# Patient Record
Sex: Female | Born: 1950
Health system: Southern US, Community
[De-identification: ages and names within clinical notes are randomized; demographics above are authoritative.]

## PROBLEM LIST (undated history)

## (undated) DIAGNOSIS — G43909 Migraine, unspecified, not intractable, without status migrainosus: Secondary | ICD-10-CM

## (undated) DIAGNOSIS — K219 Gastro-esophageal reflux disease without esophagitis: Secondary | ICD-10-CM

## (undated) DIAGNOSIS — K254 Chronic or unspecified gastric ulcer with hemorrhage: Secondary | ICD-10-CM

## (undated) DIAGNOSIS — R011 Cardiac murmur, unspecified: Secondary | ICD-10-CM

## (undated) DIAGNOSIS — Z8489 Family history of other specified conditions: Secondary | ICD-10-CM

## (undated) DIAGNOSIS — I10 Essential (primary) hypertension: Secondary | ICD-10-CM

## (undated) DIAGNOSIS — IMO0002 Reserved for concepts with insufficient information to code with codable children: Secondary | ICD-10-CM

## (undated) DIAGNOSIS — Z87442 Personal history of urinary calculi: Secondary | ICD-10-CM

## (undated) HISTORY — PX: UPPER GASTROINTESTINAL ENDOSCOPY: SHX188

## (undated) HISTORY — DX: Cardiac murmur, unspecified: R01.1

## (undated) HISTORY — DX: Chronic or unspecified gastric ulcer with hemorrhage: K25.4

## (undated) HISTORY — DX: Personal history of urinary calculi: Z87.442

## (undated) HISTORY — PX: KNEE SURGERY: SHX244

## (undated) HISTORY — PX: ELBOW SURGERY: SHX618

## (undated) HISTORY — PX: WISDOM TOOTH EXTRACTION: SHX21

## (undated) HISTORY — PX: SHOULDER ARTHROSCOPY: SHX128

## (undated) HISTORY — PX: TONSILLECTOMY: SUR1361

---

## 1980-01-24 HISTORY — PX: ABDOMINAL HYSTERECTOMY: SHX81

## 1994-12-13 ENCOUNTER — Encounter: Payer: Self-pay | Admitting: Internal Medicine

## 2000-04-16 ENCOUNTER — Encounter: Payer: Self-pay | Admitting: Family Medicine

## 2000-04-16 ENCOUNTER — Encounter: Admission: RE | Admit: 2000-04-16 | Discharge: 2000-04-16 | Payer: Self-pay | Admitting: Family Medicine

## 2000-04-19 ENCOUNTER — Encounter: Admission: RE | Admit: 2000-04-19 | Discharge: 2000-04-19 | Payer: Self-pay | Admitting: Family Medicine

## 2000-04-19 ENCOUNTER — Encounter: Payer: Self-pay | Admitting: Family Medicine

## 2000-04-25 ENCOUNTER — Ambulatory Visit (HOSPITAL_COMMUNITY): Admission: RE | Admit: 2000-04-25 | Discharge: 2000-04-25 | Payer: Self-pay | Admitting: Urology

## 2000-10-22 ENCOUNTER — Emergency Department (HOSPITAL_COMMUNITY): Admission: EM | Admit: 2000-10-22 | Discharge: 2000-10-22 | Payer: Self-pay | Admitting: Emergency Medicine

## 2000-10-22 ENCOUNTER — Encounter: Payer: Self-pay | Admitting: Emergency Medicine

## 2000-11-01 ENCOUNTER — Encounter: Admission: RE | Admit: 2000-11-01 | Discharge: 2000-11-01 | Payer: Self-pay | Admitting: Gynecology

## 2000-11-01 ENCOUNTER — Encounter: Payer: Self-pay | Admitting: Gynecology

## 2000-11-16 ENCOUNTER — Encounter: Payer: Self-pay | Admitting: Gynecology

## 2000-11-16 ENCOUNTER — Encounter: Admission: RE | Admit: 2000-11-16 | Discharge: 2000-11-16 | Payer: Self-pay | Admitting: Gynecology

## 2003-12-04 ENCOUNTER — Ambulatory Visit: Payer: Self-pay | Admitting: Internal Medicine

## 2003-12-24 ENCOUNTER — Emergency Department (HOSPITAL_COMMUNITY): Admission: EM | Admit: 2003-12-24 | Discharge: 2003-12-24 | Payer: Self-pay | Admitting: Emergency Medicine

## 2004-06-17 ENCOUNTER — Encounter: Admission: RE | Admit: 2004-06-17 | Discharge: 2004-06-17 | Payer: Self-pay | Admitting: *Deleted

## 2005-07-21 ENCOUNTER — Ambulatory Visit: Payer: Self-pay | Admitting: Internal Medicine

## 2005-08-04 ENCOUNTER — Ambulatory Visit: Payer: Self-pay

## 2005-08-04 ENCOUNTER — Encounter: Payer: Self-pay | Admitting: Internal Medicine

## 2005-08-07 ENCOUNTER — Ambulatory Visit: Payer: Self-pay | Admitting: Internal Medicine

## 2005-09-28 ENCOUNTER — Ambulatory Visit: Payer: Self-pay | Admitting: Internal Medicine

## 2006-12-03 ENCOUNTER — Encounter: Admission: RE | Admit: 2006-12-03 | Discharge: 2006-12-03 | Payer: Self-pay | Admitting: Obstetrics and Gynecology

## 2007-02-11 ENCOUNTER — Encounter: Admission: RE | Admit: 2007-02-11 | Discharge: 2007-02-11 | Payer: Self-pay | Admitting: Obstetrics and Gynecology

## 2007-08-16 ENCOUNTER — Encounter: Admission: RE | Admit: 2007-08-16 | Discharge: 2007-08-16 | Payer: Self-pay | Admitting: Obstetrics and Gynecology

## 2007-12-22 ENCOUNTER — Inpatient Hospital Stay (HOSPITAL_COMMUNITY): Admission: EM | Admit: 2007-12-22 | Discharge: 2007-12-30 | Payer: Self-pay | Admitting: Emergency Medicine

## 2007-12-23 ENCOUNTER — Ambulatory Visit: Payer: Self-pay | Admitting: Internal Medicine

## 2007-12-24 ENCOUNTER — Encounter: Payer: Self-pay | Admitting: Internal Medicine

## 2007-12-27 ENCOUNTER — Encounter: Payer: Self-pay | Admitting: Internal Medicine

## 2007-12-30 ENCOUNTER — Encounter (INDEPENDENT_AMBULATORY_CARE_PROVIDER_SITE_OTHER): Payer: Self-pay | Admitting: *Deleted

## 2008-01-21 ENCOUNTER — Other Ambulatory Visit: Admission: RE | Admit: 2008-01-21 | Discharge: 2008-01-21 | Payer: Self-pay | Admitting: Obstetrics and Gynecology

## 2008-01-21 ENCOUNTER — Encounter (INDEPENDENT_AMBULATORY_CARE_PROVIDER_SITE_OTHER): Payer: Self-pay | Admitting: Internal Medicine

## 2008-01-21 LAB — HM PAP SMEAR

## 2008-01-23 ENCOUNTER — Encounter: Admission: RE | Admit: 2008-01-23 | Discharge: 2008-01-23 | Payer: Self-pay | Admitting: Obstetrics and Gynecology

## 2008-03-11 DIAGNOSIS — J45909 Unspecified asthma, uncomplicated: Secondary | ICD-10-CM | POA: Insufficient documentation

## 2008-03-11 DIAGNOSIS — Z8711 Personal history of peptic ulcer disease: Secondary | ICD-10-CM | POA: Insufficient documentation

## 2008-03-11 DIAGNOSIS — K224 Dyskinesia of esophagus: Secondary | ICD-10-CM | POA: Insufficient documentation

## 2008-03-11 DIAGNOSIS — I10 Essential (primary) hypertension: Secondary | ICD-10-CM | POA: Insufficient documentation

## 2008-03-11 DIAGNOSIS — Z8679 Personal history of other diseases of the circulatory system: Secondary | ICD-10-CM | POA: Insufficient documentation

## 2008-03-11 DIAGNOSIS — K922 Gastrointestinal hemorrhage, unspecified: Secondary | ICD-10-CM | POA: Insufficient documentation

## 2008-03-11 DIAGNOSIS — F411 Generalized anxiety disorder: Secondary | ICD-10-CM | POA: Insufficient documentation

## 2008-03-11 DIAGNOSIS — Z87898 Personal history of other specified conditions: Secondary | ICD-10-CM | POA: Insufficient documentation

## 2008-03-11 DIAGNOSIS — D500 Iron deficiency anemia secondary to blood loss (chronic): Secondary | ICD-10-CM | POA: Insufficient documentation

## 2008-03-13 ENCOUNTER — Ambulatory Visit: Payer: Self-pay | Admitting: Internal Medicine

## 2009-01-14 ENCOUNTER — Encounter: Admission: RE | Admit: 2009-01-14 | Discharge: 2009-01-14 | Payer: Self-pay | Admitting: Obstetrics and Gynecology

## 2009-08-13 ENCOUNTER — Encounter: Admission: RE | Admit: 2009-08-13 | Discharge: 2009-08-13 | Payer: Self-pay | Admitting: Specialist

## 2009-08-23 ENCOUNTER — Encounter (INDEPENDENT_AMBULATORY_CARE_PROVIDER_SITE_OTHER): Payer: Self-pay | Admitting: Specialist

## 2009-08-23 ENCOUNTER — Ambulatory Visit: Payer: Self-pay | Admitting: Vascular Surgery

## 2009-08-23 ENCOUNTER — Ambulatory Visit: Admission: RE | Admit: 2009-08-23 | Discharge: 2009-08-23 | Payer: Self-pay | Admitting: Specialist

## 2010-02-11 ENCOUNTER — Encounter
Admission: RE | Admit: 2010-02-11 | Discharge: 2010-02-11 | Payer: Self-pay | Source: Home / Self Care | Attending: Obstetrics and Gynecology | Admitting: Obstetrics and Gynecology

## 2010-06-07 NOTE — H&P (Signed)
Kathleen Reyes, Kathleen Reyes                ACCOUNT NO.:  [id number]   MEDICAL RECORD NO.:  [id number]          PATIENT TYPE:  INP   LOCATION:  2903                         FACILITY:  [facility]   PHYSICIAN:  Eduard Clos, MDDATE OF BIRTH:  [birth year]   DATE OF ADMISSION:  12/22/2007  DATE OF DISCHARGE:                              HISTORY & PHYSICAL   CHIEF COMPLAINT:  Throwing up blood.   HISTORY OF PRESENT ILLNESS:  60 year old female with a history of  hypertension, migraine headaches who had recent renal stone and was on  ketorolac, history of CVA, previous history of GI bleed 23 years ago  when she needed a cauterization, presently with complaints of throwing  up blood twice today in the evening while taking shower.  The patient  initially felt some discomfort in the epigastric area, then she felt a  little dizzy, and then threw up blood twice.  Both times it was blood  mixed with some mucus.  Did not have any diarrhea or blood per stools.  In the ER the patient had positive guaiac stools.  Hemoglobin is around  10 and hematocrit of 32.  The patient has been admitted for further  evaluation and management of GI bleed.  The patient is also on aspirin  for her previous CVA.  The patient states that last week she did have  some abdominal pain when she was diagnosed with renal stones which have  been passed spontaneously.  Was given ketorolac for pain medication.  She did take for two days after that.  She developed some gastric  discomfort and she stopped taking it and today while taking bath she had  this episode where she threw up twice.  Denies any chest pain, shortness  of breath, palpitation, loss of consciousness, weakness of limbs,  dysuria, discharges, or diarrhea.   PAST MEDICAL HISTORY:  1. Asthma.  2. Hypertension.  3. History of previous GI bleed 23 years ago wherein she had required      cauterization.  4. Migraine headaches.   PAST SURGICAL HISTORY:  1.  Hysterectomy.  2. Appendectomy.  3. EGD with the cauterization for GI bleed.   MEDICATIONS:  1. Protonix 40 mg daily.  2. Topamax 25 mg p.o. b.i.d.  3. Aspirin 81 mg p.o. daily.  4. Albuterol HFA.  5. Advair Discus 100/50 one puff daily.  6. Climara for estrogen  replacement.  7. 12.5 mg p.o. daily.  8. Phenergan 25 mg p.o. q.6 p.r.n.  9. Topamax 75 mg p.o. daily.   ALLERGIES:  SULFA.   FAMILY HISTORY:  Significant for father having colon cancer.  The  patient did have a colonoscopy as above, which was negative per patient.   SOCIAL HISTORY:  The patient denies smoking cigarettes, drinks alcohol  occasionally, denies any drug abuse.   REVIEW OF SYSTEMS:  As per history of present illness, nothing else  significant.   PHYSICAL EXAMINATION:  The patient was examined at bedside, not in acute  distress.  VITAL SIGNS:  Blood pressure is 140/50, pulse 90 per minute, with a  temperature 97.4, respiration  18 per minute, with an O2 sat 100% room  air.  HEENT:  Anicteric, no pallor.  CHEST:  Bilateral air entry present.  No rhonchi.  No crepitation.  HEART:  S1 and S2 heard.  ABDOMEN:  Soft, nontender.  Bowel sounds present.  CNS:  Alert, awake, oriented to time, place, and person.  EXTREMITIES:  Moving upper and lower extremities 5/5.  Peripheral pulses  felt.  No edema.   LABS:  EKG normal sinus rhythm with no acute ST-T wave changes.  Chest x-  ray with moderate hyperinflation, no definite active process.  CBC:  WBC  10.1, hemoglobin 10.8, hematocrit 32.4, platelets 245,000, neutrophils  62%.  Complete metabolic panel:  Sodium 139, potassium 3.7, chloride  111, carbon dioxide 22, glucose 113, BUN 39, creatinine 0.6, total  bilirubin 0.6, alkaline phosphatase 51, AST 19, ALT 15, total protein  6.4, albumin 4, calcium 9, lipase 40.  UA is positive for ketones,  negative for glucose, nitrites, leukocytes.  Stool for occult blood is  positive.   ASSESSMENT:  1. Gastrointestinal  bleed.  2. History of hypertension.  3. History of previous gastrointestinal bleed 23 years ago, requiring      cauterization.  4. History of cerebrovascular accident.  5. History of migraines.  6. Recent use of nonsteroidal anti-inflammatory drugs.   PLAN:  To move the patient to step-down unit for closer monitoring.  We  will check CBC now and q.6 hourly for the next 48 hours.  Type and cross  match 4 units packed red blood cells.  Transfuse if there is any  significant reduction in the hemoglobin or is becoming hemodynamically  unstable.  Place the patient on D5 normal saline __________ medication.  Hold off aspirin and Toprol XL for now and I have already discussed with  on-call gastroenterologist from [facility] and we will place the patient on  IV Protonix drips.      Eduard Clos, MD  Electronically Signed     ANK/MEDQ  D:  12/22/2007  T:  12/22/2007  Job:  [id number]

## 2010-06-07 NOTE — Discharge Summary (Signed)
NAME:  Kathleen Reyes, DIDONATO NO.:  [id number]   MEDICAL RECORD NO.:  [id number]          PATIENT TYPE:  INP   LOCATION:  5021                         FACILITY:  [facility]   PHYSICIAN:  Isidor Holts, M.D.  DATE OF BIRTH:  1950/02/10   DATE OF ADMISSION:  12/22/2007  DATE OF DISCHARGE:  12/30/2007                               DISCHARGE SUMMARY   PRIMARY MEDICAL DOCTOR:  Dr. Lois Huxley, [facility], [place], [place].   DISCHARGE DIAGNOSES:  1. 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      Required endoscopic intervention x2.  2. Acute blood loss anemia secondary to #1.  Required transfusion of 3      units packed red blood cells.  3. Hypertension.  4. Previous history of cerebrovascular accident.  5. Urolithiasis.  6. Bronchial asthma.  7. History of migraines.  8. History of gastrointestinal bleed 23 years ago, that required      cautery.   DISCHARGE MEDICATIONS:  1. Protonix 40 mg p.o. b.i.d. for 1 month, and then once daily.  2. Topamax 25 mg p.o. t.i.d.  3. Albuterol inhaler p.r.n. q.i.d.  4. Climara 0.06 mg p.o. daily.  5. Ambien 10 mg p.r.n. q.h.s.  6. Advair Diskus (100/50) one puff b.i.d.  7. Toprol-XL 25 mg p.o. daily.  8. Nu-Iron 150 mg p.o. daily, to be commenced from January 13, 2008.   Note: Recommended to avoid all NSAIDS.   CONSULTATIONS:  1. Dr. Lina Sar, gastroenterology.  2. Dr. Cyndia Bent, surgeon.   PROCEDURES:  1. Chest x-ray dated January 17, 2008.  This showed moderate      hyperinflation.  No definite active process.  2. Upper GI endoscopy on December 24, 2007 performed by Dr. Lina Sar.  This showed an ulcer in the pyloric sphincter at 2      o'clock, minimum size 10 mm, maximum size 30 mm, actively      bleeding/oozing, injected with epinephrine four quadrants and a      clip used.  However, clip fell off due to location of the fibrous      tissue.  3. Repeat upper GI  endoscopy on December 27, 2007 by Dr. Lina Sar.      This revealed a pyloric channel ulcer as described above, actively      bleeding, injected with epinephrine four quadrant within 1 mm.      Outcome successful.   ADMISSION HISTORY:  As in H&P note of December 22, 2007 dictated by Dr.  Eduard Clos.  However, in brief, this is a 60 year old female,  with known history of bronchial asthma, hypertension, history of  previous GI bleed 23 years ago, status post cauterization, migraine  headaches, previous CVA, urolithiasis, who presented with 2 episodes of  hematemesis while taking a shower on the evening of admission.  Apparently, she had been diagnosed with renal stones which had passed  spontaneously, and she was given ketorolac for pain medication, which  she took for 2 days, but stopped after she developed abdominal  discomfort.  At the time of the initial evaluation in the emergency  department, she was found to have a hemoglobin of 10.8, blood pressure  was 140/50, pulse 90 per minute.  She was admitted for further  evaluation, investigation and management.   CLINICAL COURSE:  1. Upper GI bleed.  For details of presentation, refer to admission      history above.  The patient's hemoglobin at the time of      presentation was 10.8; however, by the a.m. of December 23, 2007,      hemoglobin had dropped to 8.7.  Blood pressure was 98/60 against      admission blood pressure of 140/50.  She was transfused with 2      units of packed red blood cells, resulting in satisfactory post      transfusion bump in hemoglobin to 10.2 on December 24, 2007.  She      underwent upper GI endoscopy by Dr. Lina Sar,      gastroenterologist, who was called on consultation, on December 24, 2007.  This showed a pyloric channel ulcer which was addressed with      Epinephrine injection.  An attempt was made to place an endoclip,      but this fell off secondary to location and fibrous  tissue.   1. Acute blood loss anemia.  As described above, the patient required      2 units of packed red blood cells in the a.m. of December 23, 2007      for a significant drop in hemoglobin associated with      hemodynamically instability.  Post transfusion hemoglobin was 10.2      on December 24, 2007.  However, on December 26, 2007, the patient      experienced a further drop in hemoglobin to 8.7 necessitating      transfusion with a further 1 unit of packed red blood cells, with a      bump in hemoglobin to 9.9.  She underwent repeat upper GI endoscopy      on December 27, 2007.  Again, the same bleeding lesion was      visualized and injected once again with Epinephrine.  Per GI      recommendations, surgical consultation was called.  This was kindly      provided by Dr. Jamey Ripa.  For details of that consultation, refer to      consultation note of December 27, 2007.  Per surgical      recommendations, continued observation for rebleeding was      indicated, and should this recur, the plan would be to provide      surgical intervention.   1. History of bronchial asthma.  The patient remained asymptomatic      from this viewpoint, throughout the course of her hospitalization.   1. Hypertension.  The patient remained normotensive throughout the      course of her hospitalization.   1. Migraine headaches.  This did not prove problematic.   DISPOSITION:  The patient was on December 30, 2007, considered  clinically stable for discharge.  During the course of her  hospitalization, she had been managed with intravenous infusion of  Protonix; however, on December 29, 2007, she was transitioned to twice  daily Protonix.  She was reviewed by Dr. Iva Boop in the a.m. of  December 30, 2007, and, as she had had no further occurrence of her GI  bleed and hemoglobin remained stable at 9.9, she was considered  clinically stable for discharge from a gastroenterology viewpoint.    DIET:  Heart healthy.   ACTIVITY:  As tolerated.  Recommended to increase activity slowly.   FOLLOW UP INSTRUCTIONS:  The patient is to follow up with the  gastroenterologist, Dr. Lina Sar, telephone number [phone].  An  appointment has been scheduled for January 29, 2008, at 11:00 a.m.  In  addition, the patient is recommended to follow up with her primary M.D.,  Dr. Lois Huxley, within 1 week of discharge.  She is to call for an  appointment.   SPECIAL INSTRUCTIONS:  The patient has been recommended to present to  the nearest hospital should she experience melenic stools or profound  weakness.  She has, of course, been strongly advised to avoid all anti-  inflammatory medications.  All this has been communicated to the patient  and her spouse, who verbalized understanding.      Isidor Holts, M.D.  Electronically Signed     CO/MEDQ  D:  12/30/2007  T:  12/30/2007  Job:  [id number]   cc:   Lois Huxley, M.D.  Hedwig Morton. Juanda Chance, MD

## 2010-06-07 NOTE — Consult Note (Signed)
Kathleen Reyes, Kathleen Reyes                ACCOUNT NO.:  [id number]   MEDICAL RECORD NO.:  [id number]          PATIENT TYPE:  INP   LOCATION:  2927                         FACILITY:  [facility]   PHYSICIAN:  Currie Paris, M.D.DATE OF BIRTH:  1950-06-15   DATE OF CONSULTATION:  12/27/2007  DATE OF DISCHARGE:                                 CONSULTATION   REQUESTING PHYSICIAN:  Dora M. Juanda Chance, MD   REASON FOR CONSULTATION:  Recurrent bleeding, pyloric channel ulcer.   HISTORY OF PRESENT ILLNESS:  Ms. Hodgens is a 60 year old female patient  with history of hypertension and asthma who has remote history of GI  bleeding about 23 years prior.  She has a known sensitivity issue  related to NSAID use, but had a recent orthopedic injury and started an  NSAID short-term trial from the local orthopedist.  After 2 doses, the  patient began having abdominal pain and stopped the medication.  She was  admitted on December 22, 2007 with hematemesis and symptomatic acute  blood loss anemia.  She initially underwent endoscopy several days ago  that demonstrated a pyloric channel ulcer.  The location of this ulcer  was not suitable for clip occasion nor was there any obvious area within  the ulcer itself that could be clipped.  It was a diffuse disease in  process.  Therefore, the patient underwent injection of epinephrine and  cautery procedure per Dr. Juanda Chance.  The patient was stable until past 24  hours when her hemoglobin began to drift again.  She has lost about 2  grams so far.  She returned to endoscopy today where she new had  recurrent bleeding in the same area.  This ulcer is estimated to be  about 1.3 cm in total diameter.  She has been given similar treatment  and so far the bleeding has stopped.  A surgical consultation is  requested in the event surgical intervention needs to be undertaken due  to continued bleeding.   REVIEW OF SYSTEMS:  As per the history of present illness.  GI:  The  patient reports that the bleeding is painless.  She has not had any more  hematemesis since arrival and as noted she has not had any problems up  until recently with recurrent GI bleeding.   SOCIAL HISTORY:  No tobacco.  Social alcohol.  Family is at bedside.   FAMILY MEDICAL HISTORY:  Noncontributory to this admission.   PAST MEDICAL HISTORY:  1. Asthma.  2. Hypertension.  3. Remote GI bleed 23 years prior.  4. Migraine headaches.   PAST SURGICAL HISTORY:  1. Abdominal hysterectomy via Pfannenstiel incision.  2. Appendectomy.   CURRENT MEDICATIONS:  Protonix, Topamax, Advair, and Carafate.   ALLERGIES:  NKDA.   PHYSICAL EXAMINATION:  GENERAL:  Pleasant female patient, currently  moderately sedated but arousable and talking in endoscopy.  Family is at  the bedside.  VITAL SIGNS:  Temperature 98.2, BP 108/74, pulse 86 and regular, and  respirations 16.  PSYCH:  The patient is recovering from sedation from endoscopy, but she  is arousable and affect is  appropriate to current situation.  NEUROLOGIC:  Cranial nerves II through XII are grossly intact given the  fact the patient is somewhat sedated.  She is moving all extremities x4.  She is examined on the stretcher in endoscopy.  EYES:  Sclerae noninjected and nonicteric.  EARS, NOSE, AND THROAT:  Ears are symmetrical.  No otorrhea and no  rhinorrhea.  Oral mucous membranes are pink and moist.  CHEST:  Bilateral lung sounds are clear to auscultation.  Respiratory  effort is nonlabored.  She is saturating 98% on 2 L O2.  CARDIOVASCULAR:  Heart sounds are S1 and S2.  No murmurs, rubs, clicks,  or gallops.  No JVD.  No peripheral edema.  ABDOMEN:  Soft, nontender with distant bowel sounds present.  EXTREMITIES:  Symmetrical in appearance without cyanosis or clubbing.   LABORATORY DATA:  Hemoglobin today was 9.7.  In the past 48 hours, her  hemoglobin has dropped to this from just above 11, and hematocrit 28.2.  Sodium 138,  potassium 3.3, CO2 20, glucose 91, BUN 5, and creatinine  0.60.   DIAGNOSTICS:  Endoscopy is noted with a 1.3 cm ulcerated area in the  pyloric channel.  Chest x-ray at admission showed no acute process.   IMPRESSION:  1. Recurrent gastrointestinal bleeding, pyloric channel ulcer with      associated acute blood loss anemia.  2. Asthma, compensated.  3. Hypertension, controlled.  4. History of migraines.   PLAN:  1. We will continue to observe for continued bleeding.  If this is a      problem, the patient will probably need operative intervention.  2. Gastroenterologist has placed the patient on Protonix drip, NG tube      for abdominal decompression, and transfer to Step-Down Unit.      Allison L. Rennis Harding, N.P.      Currie Paris, M.D.  Electronically Signed   ALE/MEDQ  D:  12/27/2007  T:  12/28/2007  Job:  [id number]   cc:   Hedwig Morton. Juanda Chance, MD

## 2010-06-10 NOTE — Op Note (Signed)
[facility]  Patient:    Kathleen Reyes, Kathleen Reyes                   MRN: [id number] Proc. Date: 04/25/00 Adm. Date:  [id number] Attending:  Laqueta Jean CC:         Burnell Blanks, M.D., [place] [facility]   Operative Report  PREOPERATIVE DIAGNOSIS:  Right flank pain radiating to the right lower quadrant.  POSTOPERATIVE DIAGNOSIS:  Right flank pain radiating to the right lower quadrant.  OPERATION:  Cystourethroscopy, right retrograde pyelogram, right ureteroscopy, right double-J catheter.  SURGEON:  Sigmund I. Patsi Sears, M.D.  ANESTHESIA:  General.  REVIEW OF HISTORY:  [name] is a 60 year old married white female, with two weeks of right lower quadrant pain, right flank pain.  She had noncontrast CT at [facility], which showed mild right hydronephrosis, but no stone was identified.  The patient is ten years status post TAH/BSO for endometriosis. She has a history of mitral valve prolapse as well, and a history of bleeding ulcer.  She is now for cystoscopy, retrograde pyelography.  PREPARATION:  After appropriate preanesthesia, the patient is brought to the operating room and placed on the operating table in the dorsal supine position where general LMA anesthesia was introduced.  She was then replaced in dorsal lithotomy position where the pubis was prepped with Betadine solution and draped in the usual fashion.  DESCRIPTION OF PROCEDURE:  Cystoscopy was performed which showed a normal appearing bladder.  Right retrograde pyelogram was performed through a normal appearing ureter, and retrograde pyelogram showed a normal ureter without dilation.  Ureteroscopy was accomplished with the 6 French short ureteroscope, and ureteroscope was able to be passed alongside a wire, in which showed into the renal pelvis.  The renal pelvis appeared normal with no evidence of stone or hyperemia.  It was elected to place the double-J because of  the patients chronic pain, as discussed with her preop.  Therefore, a 6 cm x 24 cm double-J catheter was passed over the guidewire into the renal pelvis under fluoroscopic control. The tube coiled in the bladder and then the renal pelvis.  The bladder was drained of fluid.  Xylocaine jelly was left in the ureter and B&O suppository was given prior to awakening.  She was not given any IV Toradol prior to awakening because of her history of ulcer disease.  The patient tolerated the procedure well, was awakened, and taken to the recovery room in good condition. DD:  04/25/00 TD:  04/25/00 Job: [id number]

## 2010-06-10 NOTE — Assessment & Plan Note (Signed)
Kathleen Reyes                              CARDIOLOGY OFFICE NOTE   NAME:Reyes, Kathleen HARTL                       MRN:          [id number]  DATE:09/28/2005                            DOB:          10/12/1950    IDENTIFICATION:  Kathleen Reyes is a 60 year old woman who I saw back in mid-  July and, prior to that, on a first-time, first visit on June 29.  Had a  history of chest pain.   Evaluation for chest pain included a Myoview scan which showed no ischemia  and an echocardiogram that showed no evidence of MVP.  LVF was normal.   I placed the patient on Protonix and she noted some improvement when I last  saw her.  I wanted to see her back to confirm some resolution.   Since then, the patient has done very well.  She denies chest pain. No  shortness of breath, except for 1 episode when she was under extreme stress.   Remains active.   CURRENT MEDICATIONS:  1. Advair 100/50 q. day to b.i.d.  2. Fluticasone 50 q. day.  3. Aspirin 81 mg q. day.  4. Multivitamin q. day.  5. Topamax.  6. Protonix 40 b.i.d.  7. Toprol XL 25 q. day.   PHYSICAL EXAMINATION:  GENERAL:  The patient is in no distress.  VITAL SIGNS:  Blood pressure 122/74.  Pulse 60 and regular.  Weight 135.  LUNGS:  Clear.  No wheezes.  CARDIAC:  Regular rate and rhythm.  S1, S2.  No definite S3, S4, or murmurs.  ABDOMEN:  Benign.  EXTREMITIES:  No edema.   IMPRESSION:  Chest pain, resolved, on Protonix.  I told her she could try  backing down to 40 q. day.  If her symptoms increase, then she should be  followed up in Gastrointestinal.   Otherwise, I would not schedule a definite follow up unless her symptoms  change, worsen.                               Pricilla Riffle, MD, [facility]    PVR/MedQ  DD:  09/28/2005 DT:  09/28/2005 Job #:  [id number]

## 2010-06-10 NOTE — Assessment & Plan Note (Signed)
[facility]                              CARDIOLOGY OFFICE NOTE   NAME:Kathleen Reyes, Kathleen Reyes                       MRN:          [id number]  DATE:08/07/2005                            DOB:          [birth year]    INDICATIONS:  Kathleen Reyes is a 60 year old woman with history of chest pain.  I saw her for the first time back on June 29.  Refer to this for the full  details.   When I saw her, I was concerned for ischemia and I went ahead and scheduled  her for Myoview scan.  I also set an echocardiogram, given a murmur.  The  patient had a Myoview scan on July 13.  She exercised for 8 minutes and 30  seconds with no EKG changes.  Myoview scan showed normal perfusion, LVF was  66%.   The patient also had an echocardiogram done.  This showed no evidence of  MVP.  LV function was normal at 60%.   In the interval, the patient says she has still had chest pressure with  stress.  She has cut back on some of her physical activity until she found  out the test results.   CURRENT MEDICATIONS:  1.  Advair 100/50 b.i.d.  2.  Fluticasone daily.  3.  Aspirin 81 mg daily.  4.  Multivitamin daily.  5.  Stool softener.  6.  Topamax.  7.  Protonix 20 mg b.i.d.  8.  Toprol XL 25 mg daily.   PHYSICAL EXAMINATION:  GENERAL APPEARANCE:  The patient is in no distress.  VITAL SIGNS:  Blood pressure 136/78, pulse 78, weight 131.  LUNGS:  Clear.  CARDIAC:  Regular rate and rhythm.  S1, S2.  Grade 1-2/6 systolic murmur  heard best over the left sternal border.  ABDOMEN:  Benign.  EXTREMITIES:  No edema.   IMPRESSION:  Chest pressure.  Myoview scan looks good.  I would recommend  going up on her Protonix to 40 b.i.d.  She says there might be a slight  improvement in her symptoms.  Note:  In the past she had an upper endoscopy  done in 1996 because of food impaction, and she was found to have  esophagitis at that time as well as a peristaltic esophagus.  She denies,  though, having problems coughing out chunks of food now.  She had an  esophageal dilatation, but it does not sound like swallowing is the problem  now and she may have inflammation.  We would like to follow her up in one  months time.  Continue current regimen.  If her symptoms worsen, of course,  she should call.   I encouraged her to slowly increase her activity, and we will see how she  does.                                Pricilla Riffle, MD, [facility]    PVR/MedQ  DD:  08/07/2005  DT:  08/08/2005  Job #:  [id number]   cc:  Antonieta Pert, FNP

## 2010-08-05 ENCOUNTER — Inpatient Hospital Stay (INDEPENDENT_AMBULATORY_CARE_PROVIDER_SITE_OTHER)
Admission: RE | Admit: 2010-08-05 | Discharge: 2010-08-05 | Disposition: A | Discharge status: Home / Self Care | Payer: 59 | Source: Ambulatory Visit | Attending: Family Medicine | Admitting: Family Medicine

## 2010-08-05 DIAGNOSIS — J019 Acute sinusitis, unspecified: Secondary | ICD-10-CM

## 2010-08-05 DIAGNOSIS — H7290 Unspecified perforation of tympanic membrane, unspecified ear: Secondary | ICD-10-CM

## 2010-10-25 LAB — DIFFERENTIAL
Basophils Relative: 1 % (ref 0–1)
Lymphocytes Relative: 30 % (ref 12–46)
Lymphs Abs: 3 10*3/uL (ref 0.7–4.0)
Monocytes Absolute: 0.6 10*3/uL (ref 0.1–1.0)
Monocytes Relative: 6 % (ref 3–12)

## 2010-10-25 LAB — CBC
HCT: 26.1 % — ABNORMAL LOW (ref 36.0–46.0)
HCT: 32.4 % — ABNORMAL LOW (ref 36.0–46.0)
MCHC: 33.4 g/dL (ref 30.0–36.0)
MCHC: 34 g/dL (ref 30.0–36.0)
MCV: 95.1 fL (ref 78.0–100.0)
MCV: 96.7 fL (ref 78.0–100.0)
Platelets: 196 10*3/uL (ref 150–400)
Platelets: 197 10*3/uL (ref 150–400)
Platelets: 245 10*3/uL (ref 150–400)
RBC: 3.35 MIL/uL — ABNORMAL LOW (ref 3.87–5.11)
RDW: 13.7 % (ref 11.5–15.5)
RDW: 14 % (ref 11.5–15.5)
RDW: 14.1 % (ref 11.5–15.5)
WBC: 10.1 10*3/uL (ref 4.0–10.5)

## 2010-10-25 LAB — URINALYSIS, ROUTINE W REFLEX MICROSCOPIC
Glucose, UA: NEGATIVE mg/dL
Ketones, ur: 15 mg/dL — AB
Protein, ur: NEGATIVE mg/dL
Specific Gravity, Urine: 1.021 (ref 1.005–1.030)
Urobilinogen, UA: 1 mg/dL (ref 0.0–1.0)
pH: 6.5 (ref 5.0–8.0)

## 2010-10-25 LAB — COMPREHENSIVE METABOLIC PANEL
ALT: 15 U/L (ref 0–35)
AST: 15 U/L (ref 0–37)
Albumin: 3 g/dL — ABNORMAL LOW (ref 3.5–5.2)
BUN: 29 mg/dL — ABNORMAL HIGH (ref 6–23)
Calcium: 8.1 mg/dL — ABNORMAL LOW (ref 8.4–10.5)
Creatinine, Ser: 0.57 mg/dL (ref 0.4–1.2)
GFR calc Af Amer: >60 mL/min (ref >60)
GFR calc Af Amer: >60 mL/min (ref >60)
GFR calc non Af Amer: >60 mL/min (ref >60)
Potassium: 3.7 mEq/L (ref 3.5–5.1)
Sodium: 139 mEq/L (ref 135–145)
Total Bilirubin: 0.6 mg/dL (ref 0.3–1.2)
Total Protein: 4.7 g/dL — ABNORMAL LOW (ref 6.0–8.3)

## 2010-10-25 LAB — CROSSMATCH

## 2010-10-25 LAB — URINE CULTURE
Colony Count: NO GROWTH
Culture: NO GROWTH

## 2010-10-25 LAB — LIPID PANEL
HDL: 31 mg/dL — ABNORMAL LOW (ref >39)
Total CHOL/HDL Ratio: 4.4 RATIO
Triglycerides: 66 mg/dL (ref <150)
VLDL: 13 mg/dL (ref 0–40)

## 2010-10-25 LAB — PROTIME-INR: Prothrombin Time: 14.2 seconds (ref 11.6–15.2)

## 2010-10-25 LAB — PREPARE RBC (CROSSMATCH)

## 2010-10-25 LAB — OCCULT BLOOD X 1 CARD TO LAB, STOOL: Fecal Occult Bld: POSITIVE

## 2010-10-25 LAB — TSH: TSH: 2.712 u[IU]/mL (ref 0.350–4.500)

## 2010-10-28 LAB — BASIC METABOLIC PANEL
BUN: 4 mg/dL — ABNORMAL LOW (ref 6–23)
BUN: 5 mg/dL — ABNORMAL LOW (ref 6–23)
CO2: 20 mEq/L (ref 19–32)
Calcium: 7.7 mg/dL — ABNORMAL LOW (ref 8.4–10.5)
Calcium: 7.9 mg/dL — ABNORMAL LOW (ref 8.4–10.5)
Calcium: 8.4 mg/dL (ref 8.4–10.5)
Creatinine, Ser: 0.59 mg/dL (ref 0.4–1.2)
Creatinine, Ser: 0.6 mg/dL (ref 0.4–1.2)
GFR calc non Af Amer: >60 mL/min (ref >60)
GFR calc non Af Amer: >60 mL/min (ref >60)
GFR calc non Af Amer: >60 mL/min (ref >60)
Glucose, Bld: 103 mg/dL — ABNORMAL HIGH (ref 70–99)
Glucose, Bld: 167 mg/dL — ABNORMAL HIGH (ref 70–99)
Glucose, Bld: 96 mg/dL (ref 70–99)
Glucose, Bld: 97 mg/dL (ref 70–99)
Potassium: 3.4 mEq/L — ABNORMAL LOW (ref 3.5–5.1)
Sodium: 136 mEq/L (ref 135–145)
Sodium: 138 mEq/L (ref 135–145)
Sodium: 140 mEq/L (ref 135–145)

## 2010-10-28 LAB — HEMOGLOBIN AND HEMATOCRIT, BLOOD
HCT: 25.5 % — ABNORMAL LOW (ref 36.0–46.0)
HCT: 25.9 % — ABNORMAL LOW (ref 36.0–46.0)
HCT: 27 % — ABNORMAL LOW (ref 36.0–46.0)
HCT: 28.7 % — ABNORMAL LOW (ref 36.0–46.0)
HCT: 29.1 % — ABNORMAL LOW (ref 36.0–46.0)
HCT: 29.5 % — ABNORMAL LOW (ref 36.0–46.0)
HCT: 30.9 % — ABNORMAL LOW (ref 36.0–46.0)
HCT: 32.3 % — ABNORMAL LOW (ref 36.0–46.0)
Hemoglobin: 10.3 g/dL — ABNORMAL LOW (ref 12.0–15.0)
Hemoglobin: 10.7 g/dL — ABNORMAL LOW (ref 12.0–15.0)
Hemoglobin: 8.5 g/dL — ABNORMAL LOW (ref 12.0–15.0)
Hemoglobin: 8.7 g/dL — ABNORMAL LOW (ref 12.0–15.0)
Hemoglobin: 9.7 g/dL — ABNORMAL LOW (ref 12.0–15.0)

## 2010-10-28 LAB — CROSSMATCH: ABO/RH(D): O POS

## 2010-10-28 LAB — CBC
HCT: 27 % — ABNORMAL LOW (ref 36.0–46.0)
HCT: 30 % — ABNORMAL LOW (ref 36.0–46.0)
Hemoglobin: 10.2 g/dL — ABNORMAL LOW (ref 12.0–15.0)
Hemoglobin: 11.7 g/dL — ABNORMAL LOW (ref 12.0–15.0)
Hemoglobin: 9.1 g/dL — ABNORMAL LOW (ref 12.0–15.0)
Hemoglobin: 9.3 g/dL — ABNORMAL LOW (ref 12.0–15.0)
Hemoglobin: 9.5 g/dL — ABNORMAL LOW (ref 12.0–15.0)
Hemoglobin: 9.9 g/dL — ABNORMAL LOW (ref 12.0–15.0)
Hemoglobin: 9.9 g/dL — ABNORMAL LOW (ref 12.0–15.0)
Hemoglobin: 9.9 g/dL — ABNORMAL LOW (ref 12.0–15.0)
MCHC: 33 g/dL (ref 30.0–36.0)
MCHC: 33.1 g/dL (ref 30.0–36.0)
MCHC: 33.4 g/dL (ref 30.0–36.0)
MCHC: 33.9 g/dL (ref 30.0–36.0)
MCHC: 34.4 g/dL (ref 30.0–36.0)
MCV: 96.2 fL (ref 78.0–100.0)
Platelets: 158 10*3/uL (ref 150–400)
Platelets: 159 10*3/uL (ref 150–400)
Platelets: 164 10*3/uL (ref 150–400)
Platelets: 204 10*3/uL (ref 150–400)
RBC: 2.9 MIL/uL — ABNORMAL LOW (ref 3.87–5.11)
RDW: 15 % (ref 11.5–15.5)
RDW: 15.1 % (ref 11.5–15.5)
RDW: 15.3 % (ref 11.5–15.5)
RDW: 15.3 % (ref 11.5–15.5)
RDW: 15.5 % (ref 11.5–15.5)
RDW: 15.8 % — ABNORMAL HIGH (ref 11.5–15.5)
RDW: 15.9 % — ABNORMAL HIGH (ref 11.5–15.5)
WBC: 8 10*3/uL (ref 4.0–10.5)

## 2010-10-28 LAB — COMPREHENSIVE METABOLIC PANEL
ALT: 17 U/L (ref 0–35)
ALT: 19 U/L (ref 0–35)
Albumin: 2.6 g/dL — ABNORMAL LOW (ref 3.5–5.2)
Alkaline Phosphatase: 28 U/L — ABNORMAL LOW (ref 39–117)
BUN: 2 mg/dL — ABNORMAL LOW (ref 6–23)
Calcium: 8 mg/dL — ABNORMAL LOW (ref 8.4–10.5)
Chloride: 115 mEq/L — ABNORMAL HIGH (ref 96–112)
Glucose, Bld: 104 mg/dL — ABNORMAL HIGH (ref 70–99)
Glucose, Bld: 91 mg/dL (ref 70–99)
Potassium: 4.2 mEq/L (ref 3.5–5.1)
Sodium: 138 mEq/L (ref 135–145)
Sodium: 141 mEq/L (ref 135–145)
Total Bilirubin: 0.4 mg/dL (ref 0.3–1.2)
Total Protein: 4.5 g/dL — ABNORMAL LOW (ref 6.0–8.3)

## 2010-10-28 LAB — PROTIME-INR: INR: 1.2 (ref 0.00–1.49)

## 2010-12-22 IMAGING — MG MM DIAGNOSTIC BILATERAL
6 series · 6 of 6 positions shown · non-contrast
Comparison: 12/03/2006 and the right breast mammogram 08/16/2007.

CLINICAL DATA: Patient presents for 6-month follow-up right breast
mammogram as it is also time for patient's annual bilateral
mammogram.  This is to follow-up a group of microcalcifications in
the upper outer right breast.

DIGITAL DIAGNOSTIC BILATERAL MAMMOGRAM WITH CAD

[R CC (1 of 2)]
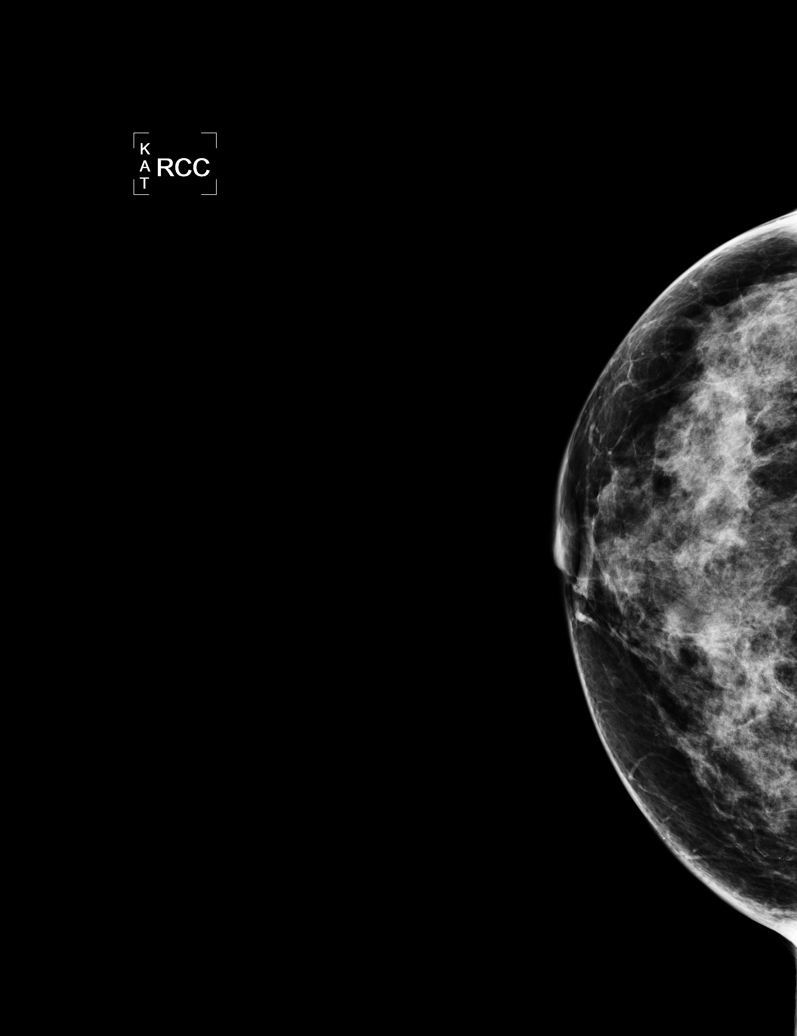

[L CC]
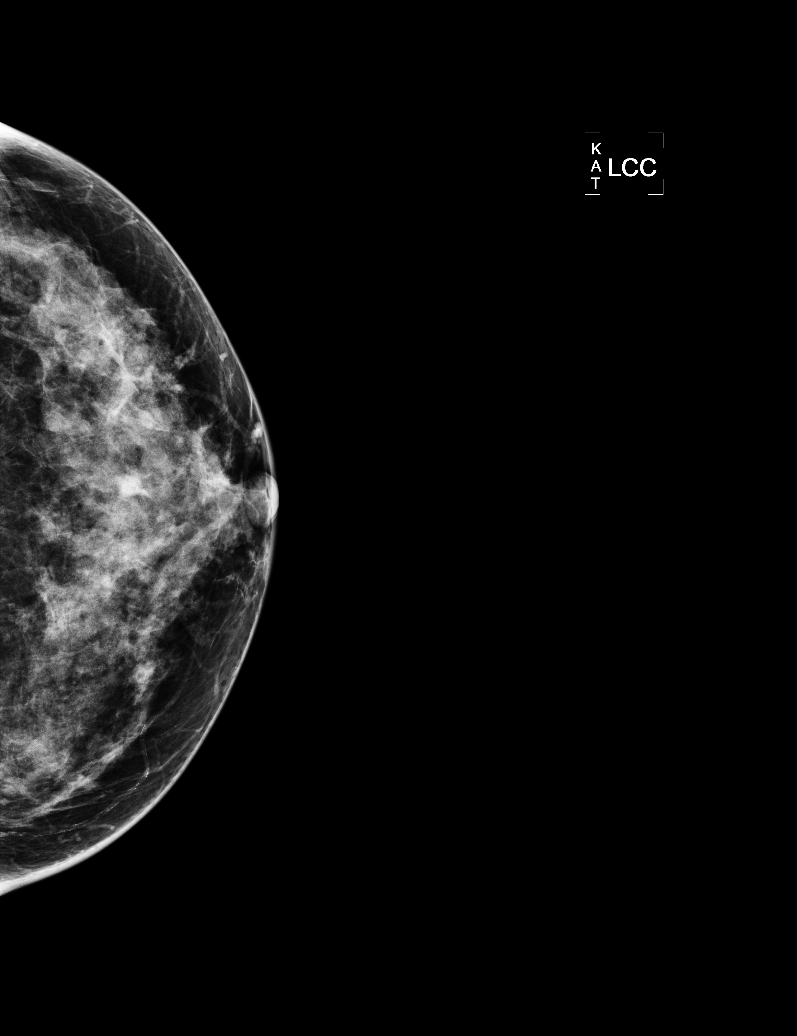

[L MLO]
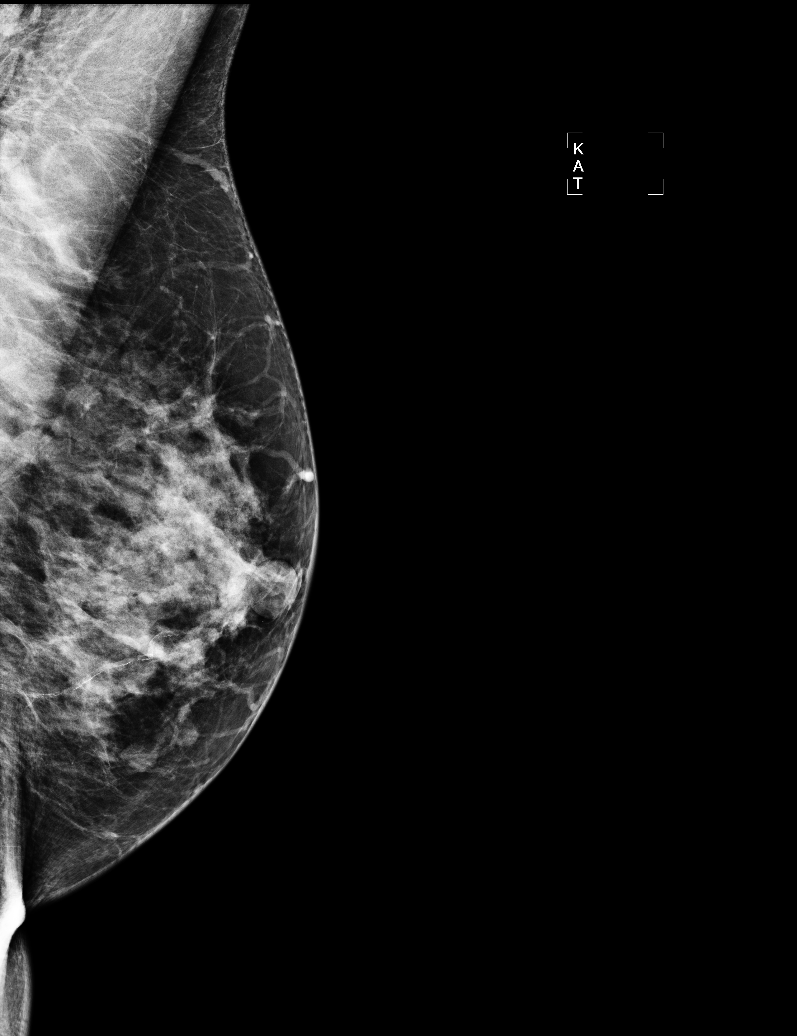

[R MLO]
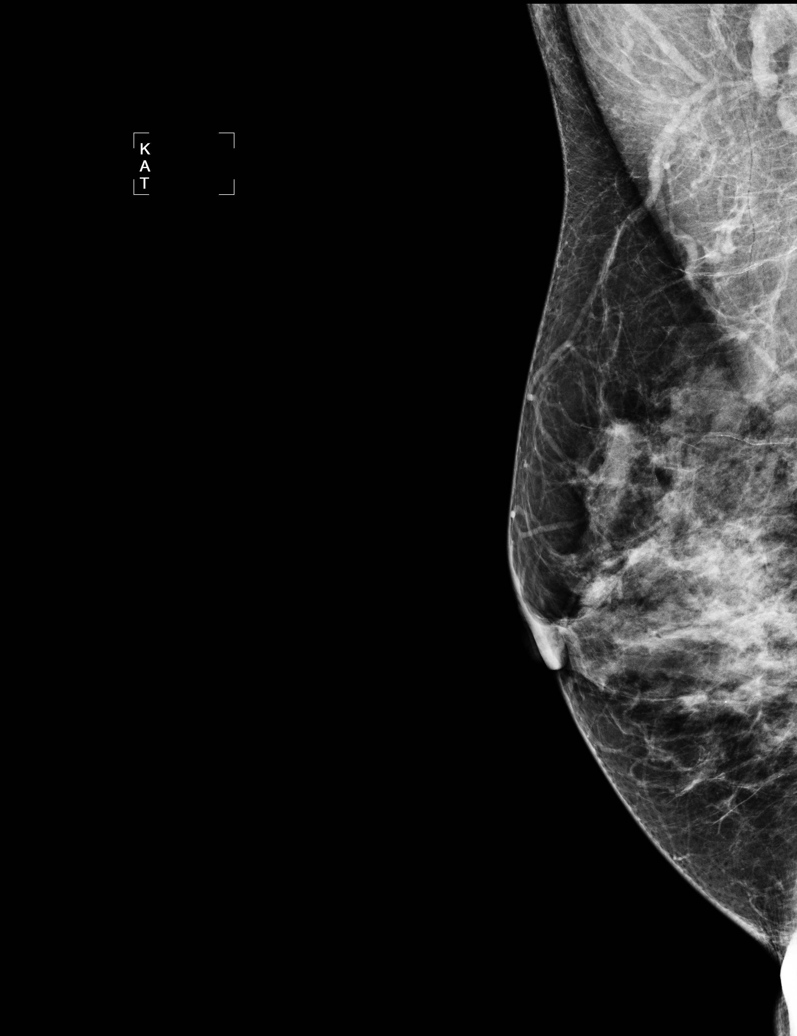

[R CC (2 of 2)]
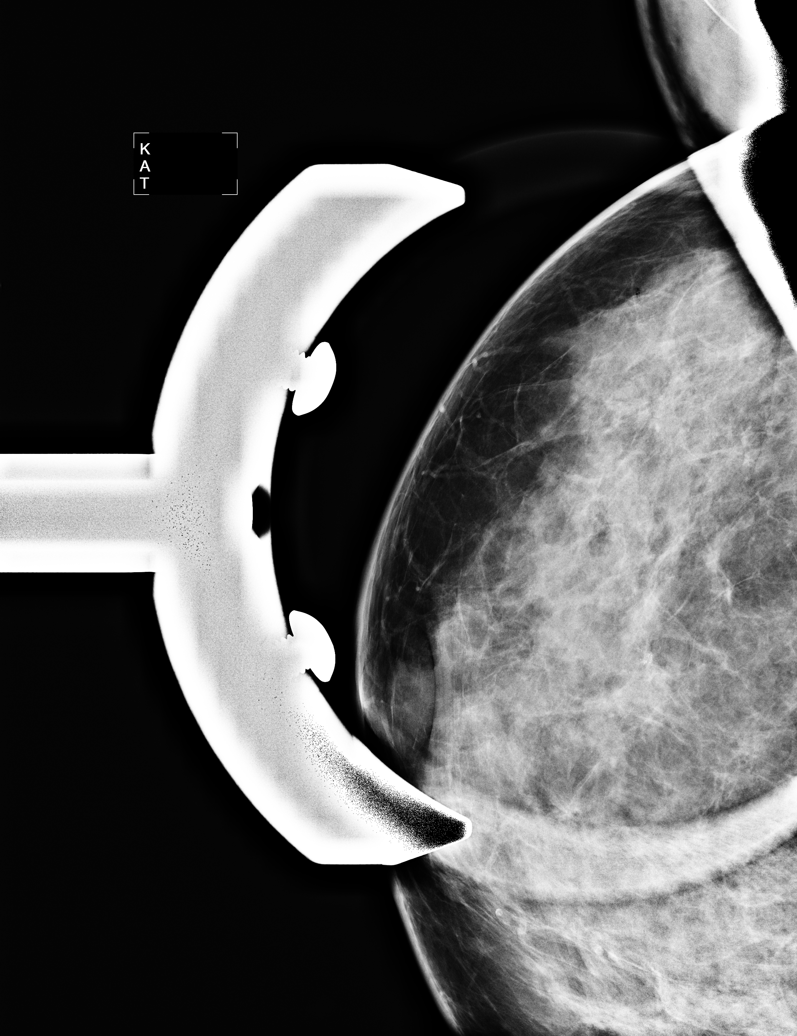

[R ML]
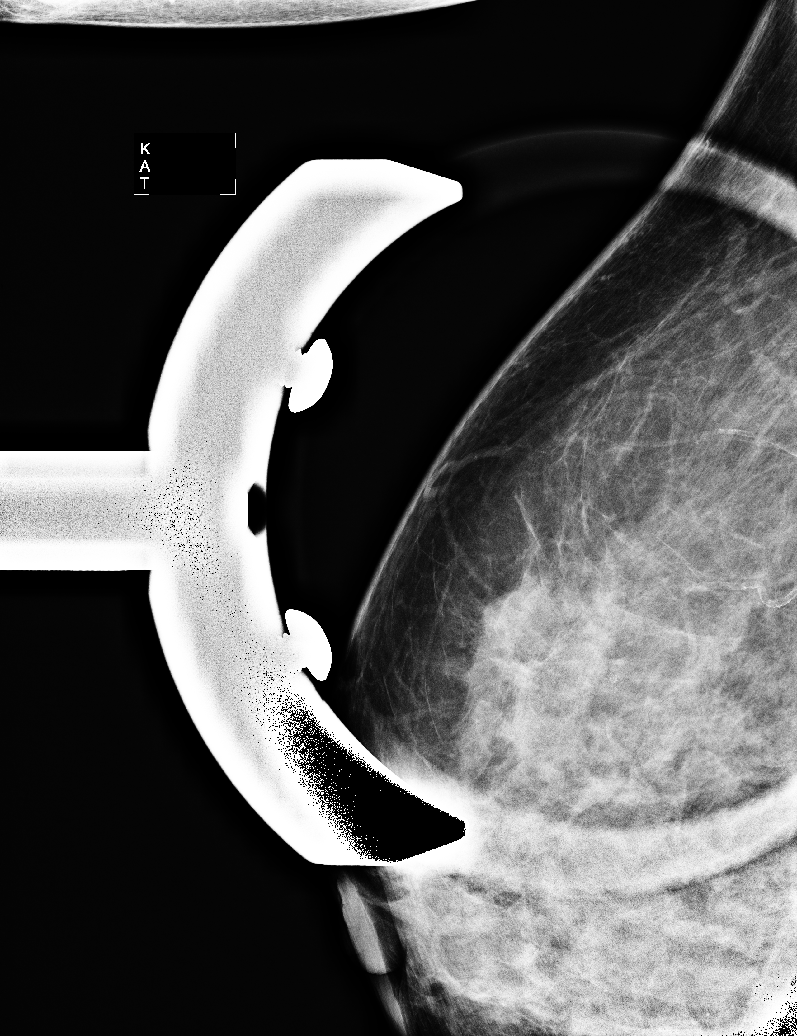

[6 of 6 positions shown; findings below may reference images not displayed]

FINDINGS: Examination demonstrates heterogeneous dense
fibroglandular tissue.  There is a stable group of
microcalcifications in the upper outer right breast.  Remainder
exam is unchanged.
IMPRESSION: Stable group of microcalcifications in the upper outer right
breast.

Recommendations:  Recommend an additional follow-up diagnostic
right breast mammogram in 1 year to document 2 years of stability
of these microcalcifications.   This will coincide with the
patient's annual bilateral mammogram.

BI-RADS CATEGORY 2:  Benign finding(s).

## 2011-05-24 ENCOUNTER — Emergency Department (INDEPENDENT_AMBULATORY_CARE_PROVIDER_SITE_OTHER)
Admission: EM | Admit: 2011-05-24 | Discharge: 2011-05-24 | Disposition: A | Discharge status: Home / Self Care | Payer: 59 | Source: Home / Self Care | Attending: Family Medicine | Admitting: Family Medicine

## 2011-05-24 ENCOUNTER — Encounter (HOSPITAL_COMMUNITY): Payer: Self-pay | Admitting: Emergency Medicine

## 2011-05-24 DIAGNOSIS — H6992 Unspecified Eustachian tube disorder, left ear: Secondary | ICD-10-CM

## 2011-05-24 DIAGNOSIS — H6980 Other specified disorders of Eustachian tube, unspecified ear: Secondary | ICD-10-CM

## 2011-05-24 DIAGNOSIS — N318 Other neuromuscular dysfunction of bladder: Secondary | ICD-10-CM

## 2011-05-24 DIAGNOSIS — N3281 Overactive bladder: Secondary | ICD-10-CM

## 2011-05-24 HISTORY — DX: Gastro-esophageal reflux disease without esophagitis: K21.9

## 2011-05-24 HISTORY — DX: Reserved for concepts with insufficient information to code with codable children: IMO0002

## 2011-05-24 HISTORY — DX: Migraine, unspecified, not intractable, without status migrainosus: G43.909

## 2011-05-24 LAB — POCT URINALYSIS DIP (DEVICE)
Bilirubin Urine: NEGATIVE
Glucose, UA: NEGATIVE mg/dL
Ketones, ur: NEGATIVE mg/dL
Leukocytes, UA: NEGATIVE
Nitrite: NEGATIVE
pH: 6.5 (ref 5.0–8.0)

## 2011-05-24 MED ORDER — METHYLPREDNISOLONE 4 MG PO KIT: PACK | ORAL | Status: AC

## 2011-05-24 MED ORDER — TOLTERODINE TARTRATE ER 4 MG PO CP24: 4.0000 mg | ORAL_CAPSULE | Freq: Every day | ORAL | Status: DC

## 2011-05-24 NOTE — ED Notes (Signed)
Multiple complaints.  Ear pain and uti symptoms.  Onset yesterday of ear pain.  Possible uti onset Monday.  Dr Artis Flock in room prior to this nurse.

## 2011-05-24 NOTE — Discharge Instructions (Signed)
Use medicine as prescribed and see your doctor if further problems, drink plenty of fluids.

## 2011-05-24 NOTE — ED Provider Notes (Signed)
History     CSN: [id number]  Arrival date & time 05/24/11  1415   First MD Initiated Contact with Patient 05/24/11 1418      Chief Complaint  Patient presents with  . Urinary Tract Infection    (Consider location/radiation/quality/duration/timing/severity/associated sxs/prior treatment) Patient is a 61 y.o. female presenting with ear pain and frequency. The history is provided by the patient.  Otalgia This is a new problem. The current episode started yesterday. There is pain in the left ear. The problem has been gradually improving (worsened with airplane flight this am to [place].). There has been no fever. The pain is mild. Pertinent negatives include no ear discharge, no hearing loss, no rhinorrhea, no sore throat, no abdominal pain, no diarrhea and no vomiting.  Urinary Frequency This is a new problem. The current episode started more than 2 days ago. The problem has been gradually worsening. Pertinent negatives include no chest pain and no abdominal pain.    Past Medical History  Diagnosis Date  . Asthma   . Migraines   . GERD (gastroesophageal reflux disease)   . Ulcer     Past Surgical History  Procedure Date  . Abdominal hysterectomy   . Knee surgery   . Elbow surgery     No family history on file.  History  Substance Use Topics  . Smoking status: Never Smoker   . Smokeless tobacco: Not on file  . Alcohol Use: Yes    OB History    Grav Para Term Preterm Abortions TAB SAB Ect Mult Living                  Review of Systems  Constitutional: Negative.   HENT: Positive for ear pain. Negative for hearing loss, sore throat, rhinorrhea and ear discharge.   Cardiovascular: Negative for chest pain.  Gastrointestinal: Negative.  Negative for vomiting, abdominal pain and diarrhea.  Genitourinary: Positive for dysuria.  Musculoskeletal: Negative.   Neurological: Negative.     Allergies  Sulfonamide derivatives  Home Medications   Current Outpatient Rx  Name  Route Sig Dispense Refill  . ESTRADIOL 0.06 MG/24HR TD PTWK Transdermal Place 1 patch onto the skin once a week.    Marland Kitchen ESTRADIOL 25 MCG VA TABS Vaginal Place 25 mcg vaginally daily.    Marland Kitchen FLUTICASONE-SALMETEROL 100-50 MCG/DOSE IN AEPB Inhalation Inhale 1 puff into the lungs every 12 (twelve) hours.    Marland Kitchen PANTOPRAZOLE SODIUM 40 MG PO TBEC Oral Take 40 mg by mouth daily.    . TOPIRAMATE 25 MG PO TABS Oral Take 25 mg by mouth 2 (two) times daily.    . METHYLPREDNISOLONE 4 MG PO KIT  follow package directions, start on thurs, take until finished. 21 tablet 0  . TOLTERODINE TARTRATE ER 4 MG PO CP24 Oral Take 1 capsule (4 mg total) by mouth daily. 30 capsule 1    BP 149/85  Pulse 66  Temp(Src) 97.7 F (36.5 C) (Oral)  Resp 18  SpO2 100%  Physical Exam  Nursing note and vitals reviewed. Constitutional: She is oriented to person, place, and time. She appears well-developed and well-nourished.  HENT:  Head: Normocephalic.  Right Ear: External ear normal.  Left Ear: External ear normal.  Nose: Nose normal.  Mouth/Throat: Oropharynx is clear and moist.  Neck: Normal range of motion. Neck supple.  Pulmonary/Chest: Effort normal and breath sounds normal.  Abdominal: Soft. Bowel sounds are normal. She exhibits no distension. There is no tenderness. There is no rebound.  Neurological: She is alert and oriented to person, place, and time. She displays a negative Romberg sign. Coordination normal.       No nystagmus.  Skin: Skin is warm and dry.    ED Course  Procedures (including critical care time)  Labs Reviewed  POCT URINALYSIS DIP (DEVICE) - Abnormal; Notable for the following:    Hgb urine dipstick TRACE (*)    All other components within normal limits   No results found.   1. Eustachian tube disorder, left   2. Overactive bladder       MDM  U/a neg.        Linna Hoff, MD 05/24/11 1535

## 2012-02-23 ENCOUNTER — Other Ambulatory Visit: Payer: Self-pay | Admitting: Obstetrics and Gynecology

## 2012-02-23 DIAGNOSIS — M8580 Other specified disorders of bone density and structure, unspecified site: Secondary | ICD-10-CM

## 2012-02-23 DIAGNOSIS — Z1231 Encounter for screening mammogram for malignant neoplasm of breast: Secondary | ICD-10-CM

## 2012-03-29 ENCOUNTER — Inpatient Hospital Stay: Admission: RE | Admit: 2012-03-29 | Payer: 59 | Source: Ambulatory Visit

## 2012-03-29 ENCOUNTER — Ambulatory Visit: Payer: 59

## 2012-04-19 ENCOUNTER — Ambulatory Visit
Admission: RE | Admit: 2012-04-19 | Discharge: 2012-04-19 | Disposition: A | Discharge status: Home / Self Care | Payer: 59 | Source: Ambulatory Visit | Attending: Obstetrics and Gynecology | Admitting: Obstetrics and Gynecology

## 2012-04-19 DIAGNOSIS — M8580 Other specified disorders of bone density and structure, unspecified site: Secondary | ICD-10-CM

## 2012-04-19 DIAGNOSIS — Z1231 Encounter for screening mammogram for malignant neoplasm of breast: Secondary | ICD-10-CM

## 2012-04-22 ENCOUNTER — Encounter: Payer: Self-pay | Admitting: Internal Medicine

## 2012-04-22 ENCOUNTER — Telehealth: Payer: Self-pay | Admitting: Nurse Practitioner

## 2012-04-22 NOTE — Telephone Encounter (Signed)
Please review results of patient mammogram done on 04/19/2012 . Patient will need refill on Vagi fem if ok sent to pharmacy [facility]. Please advise Fannie Knee.   See EPIC for mammogram and patient already notified of Bone Density results per Dr. Tresa Res.

## 2012-04-22 NOTE — Telephone Encounter (Signed)
Kathleen Reyes, I am able to bring up the view that has a mammogram was done but unable to view the results.  My guess is that it was normal or Dr. Precious Bard would have flagged it.  So the answer is can you help me view this first? Thanks PG.

## 2012-04-22 NOTE — Telephone Encounter (Signed)
PT IS RETURNING A CALL NOT SURE WHO CALLED HER.

## 2012-04-23 MED ORDER — ESTRADIOL 0.06 MG/24HR TD PTWK: 1.0000 | MEDICATED_PATCH | TRANSDERMAL | Status: DC

## 2012-04-23 MED ORDER — ESTRADIOL 10 MCG VA TABS: 10.0000 ug | ORAL_TABLET | VAGINAL | Status: DC

## 2012-04-23 NOTE — Telephone Encounter (Signed)
Kathleen Reyes go ahead and refill her HRT, now that I have seen the mammogram. Thanks

## 2012-04-23 NOTE — Telephone Encounter (Signed)
Rx vagifem and climara patch sent e=scribe to pharmacy. Patient notified of this. sue

## 2012-05-17 ENCOUNTER — Encounter: Payer: 59 | Admitting: Internal Medicine

## 2012-06-03 ENCOUNTER — Encounter: Payer: Self-pay | Admitting: Internal Medicine

## 2012-06-03 ENCOUNTER — Ambulatory Visit (AMBULATORY_SURGERY_CENTER): Payer: 59 | Admitting: *Deleted

## 2012-06-03 VITALS — Ht 62.0 in | Wt 124.0 lb

## 2012-06-03 DIAGNOSIS — Z1211 Encounter for screening for malignant neoplasm of colon: Secondary | ICD-10-CM

## 2012-06-03 MED ORDER — MOVIPREP 100 G PO SOLR: ORAL | Status: DC

## 2012-06-21 ENCOUNTER — Ambulatory Visit (AMBULATORY_SURGERY_CENTER): Payer: 59 | Admitting: Internal Medicine

## 2012-06-21 ENCOUNTER — Encounter: Payer: Self-pay | Admitting: Internal Medicine

## 2012-06-21 VITALS — BP 132/93 | HR 68 | Temp 98.4°F | Resp 16 | Ht 62.0 in | Wt 124.0 lb

## 2012-06-21 DIAGNOSIS — D126 Benign neoplasm of colon, unspecified: Secondary | ICD-10-CM

## 2012-06-21 DIAGNOSIS — Z1211 Encounter for screening for malignant neoplasm of colon: Secondary | ICD-10-CM

## 2012-06-21 MED ORDER — SODIUM CHLORIDE 0.9 % IV SOLN: 500.0000 mL | INTRAVENOUS | Status: DC

## 2012-06-21 MED ORDER — LINACLOTIDE 145 MCG PO CAPS: 145.0000 ug | ORAL_CAPSULE | Freq: Every day | ORAL | Status: DC

## 2012-06-21 NOTE — Progress Notes (Signed)
Patient did not have preoperative order for IV antibiotic SSI prophylaxis. (G8918)  Patient did not experience any of the following events: a burn prior to discharge; a fall within the facility; wrong site/side/patient/procedure/implant event; or a hospital transfer or hospital admission upon discharge from the facility. (G8907)  

## 2012-06-21 NOTE — Progress Notes (Signed)
Called to room to assist during endoscopic procedure.  Patient ID and intended procedure confirmed with present staff. Received instructions for my participation in the procedure from the performing physician.  

## 2012-06-21 NOTE — Progress Notes (Signed)
Procedure ends, to recovery awake, report given and VSS. 

## 2012-06-21 NOTE — Patient Instructions (Addendum)
Impressions/recommendations:  Polyp (handout given) Diverticulosis (handout given) High Fiber diet (handout given)  Constipation (handout given)   YOU HAD AN ENDOSCOPIC PROCEDURE TODAY AT THE [facility]: Refer to the procedure report that was given to you for any specific questions about what was found during the examination.  If the procedure report does not answer your questions, please call your gastroenterologist to clarify.  If you requested that your care partner not be given the details of your procedure findings, then the procedure report has been included in a sealed envelope for you to review at your convenience later.  YOU SHOULD EXPECT: Some feelings of bloating in the abdomen. Passage of more gas than usual.  Walking can help get rid of the air that was put into your GI tract during the procedure and reduce the bloating. If you had a lower endoscopy (such as a colonoscopy or flexible sigmoidoscopy) you may notice spotting of blood in your stool or on the toilet paper. If you underwent a bowel prep for your procedure, then you may not have a normal bowel movement for a few days.  DIET: Your first meal following the procedure should be a light meal and then it is ok to progress to your normal diet.  A half-sandwich or bowl of soup is an example of a good first meal.  Heavy or fried foods are harder to digest and may make you feel nauseous or bloated.  Likewise meals heavy in dairy and vegetables can cause extra gas to form and this can also increase the bloating.  Drink plenty of fluids but you should avoid alcoholic beverages for 24 hours.  ACTIVITY: Your care partner should take you home directly after the procedure.  You should plan to take it easy, moving slowly for the rest of the day.  You can resume normal activity the day after the procedure however you should NOT DRIVE or use heavy machinery for 24 hours (because of the sedation medicines used during the test).     SYMPTOMS TO REPORT IMMEDIATELY: A gastroenterologist can be reached at any hour.  During normal business hours, 8:30 AM to 5:00 PM Monday through Friday, call [phone].  After hours and on weekends, please call the GI answering service at [phone] who will take a message and have the physician on call contact you.   Following lower endoscopy (colonoscopy or flexible sigmoidoscopy):  Excessive amounts of blood in the stool  Significant tenderness or worsening of abdominal pains  Swelling of the abdomen that is new, acute  Fever of 100F or higher  Following upper endoscopy (EGD)  Vomiting of blood or coffee ground material  New chest pain or pain under the shoulder blades  Painful or persistently difficult swallowing  New shortness of breath  Fever of 100F or higher  Black, tarry-looking stools  FOLLOW UP: If any biopsies were taken you will be contacted by phone or by letter within the next 1-3 weeks.  Call your gastroenterologist if you have not heard about the biopsies in 3 weeks.  Our staff will call the home number listed on your records the next business day following your procedure to check on you and address any questions or concerns that you may have at that time regarding the information given to you following your procedure. This is a courtesy call and so if there is no answer at the home number and we have not heard from you through the emergency physician on call, we  will assume that you have returned to your regular daily activities without incident.  SIGNATURES/CONFIDENTIALITY: You and/or your care partner have signed paperwork which will be entered into your electronic medical record.  These signatures attest to the fact that that the information above on your After Visit Summary has been reviewed and is understood.  Full responsibility of the confidentiality of this discharge information lies with you and/or your care-partner.YOU HAD AN ENDOSCOPIC PROCEDURE  TODAY AT THE [facility]: Refer to the procedure report that was given to you for any specific questions about what was found during the examination.  If the procedure report does not answer your questions, please call your gastroenterologist to clarify.  If you requested that your care partner not be given the details of your procedure findings, then the procedure report has been included in a sealed envelope for you to review at your convenience later.  YOU SHOULD EXPECT: Some feelings of bloating in the abdomen. Passage of more gas than usual.  Walking can help get rid of the air that was put into your GI tract during the procedure and reduce the bloating. If you had a lower endoscopy (such as a colonoscopy or flexible sigmoidoscopy) you may notice spotting of blood in your stool or on the toilet paper. If you underwent a bowel prep for your procedure, then you may not have a normal bowel movement for a few days.  DIET: Your first meal following the procedure should be a light meal and then it is ok to progress to your normal diet.  A half-sandwich or bowl of soup is an example of a good first meal.  Heavy or fried foods are harder to digest and may make you feel nauseous or bloated.  Likewise meals heavy in dairy and vegetables can cause extra gas to form and this can also increase the bloating.  Drink plenty of fluids but you should avoid alcoholic beverages for 24 hours.  ACTIVITY: Your care partner should take you home directly after the procedure.  You should plan to take it easy, moving slowly for the rest of the day.  You can resume normal activity the day after the procedure however you should NOT DRIVE or use heavy machinery for 24 hours (because of the sedation medicines used during the test).    SYMPTOMS TO REPORT IMMEDIATELY: A gastroenterologist can be reached at any hour.  During normal business hours, 8:30 AM to 5:00 PM Monday through Friday, call [phone].  After hours  and on weekends, please call the GI answering service at [phone] who will take a message and have the physician on call contact you.   Following lower endoscopy (colonoscopy or flexible sigmoidoscopy):  Excessive amounts of blood in the stool  Significant tenderness or worsening of abdominal pains  Swelling of the abdomen that is new, acute  Fever of 100F or higher  FOLLOW UP: If any biopsies were taken you will be contacted by phone or by letter within the next 1-3 weeks.  Call your gastroenterologist if you have not heard about the biopsies in 3 weeks.  Our staff will call the home number listed on your records the next business day following your procedure to check on you and address any questions or concerns that you may have at that time regarding the information given to you following your procedure. This is a courtesy call and so if there is no answer at the home number and we have not heard from  you through the emergency physician on call, we will assume that you have returned to your regular daily activities without incident.  SIGNATURES/CONFIDENTIALITY: You and/or your care partner have signed paperwork which will be entered into your electronic medical record.  These signatures attest to the fact that that the information above on your After Visit Summary has been reviewed and is understood.  Full responsibility of the confidentiality of this discharge information lies with you and/or your care-partner.

## 2012-06-21 NOTE — Op Note (Addendum)
[facility] [address] [place], [address]   COLONOSCOPY PROCEDURE REPORT  PATIENT: Kathleen Reyes, Kathleen Reyes  MR#: [id number] BIRTHDATE: 04-04-1950 , 61  yrs. old GENDER: Female ENDOSCOPIST: Hart Carwin, MD REFERRED BY:  Maurice Small, M.D. , Dr Meredeth Ide PROCEDURE DATE:  06/21/2012 PROCEDURE:   Colonoscopy with snare polypectomy ASA CLASS:   Class I INDICATIONS:Average risk patient for colon cancer and had AC BE in 2001. MEDICATIONS: MAC sedation, administered by CRNA and Propofol (Diprivan) 240 mg IV  DESCRIPTION OF PROCEDURE:   After the risks and benefits and of the procedure were explained, informed consent was obtained.  A digital rectal exam revealed no abnormalities of the rectum.    The LB [id number]  endoscope was introduced through the anus and advanced to the cecum, which was identified by both the appendix and ileocecal valve .  The quality of the prep was good, using MoviPrep .  The instrument was then slowly withdrawn as the colon was fully examined.     COLON FINDINGS: A firm flat polyp ranging between 5-50mm in size was found in the ascending colon.at 60 cm  A polypectomy was performed with a cold snare.  The resection was complete and the polyp tissue was completely retrieved.   Mild diverticulosis was noted throughout the entire examined colon.     Retroflexed views revealed no abnormalities.     The scope was then withdrawn from the patient and the procedure completed.  COMPLICATIONS: There were no complications. ENDOSCOPIC IMPRESSION: 1.   Flat polyp ranging between 5-70mm in size was found in the ascending colon at 60 cm polypectomy was performed with a cold snare 2.   Mild diverticulosis was noted throughout the entire examined colon  RECOMMENDATIONS: 1.  Await pathology results 2.  High fiber diet   REPEAT EXAM: In 5 year(s)  for Colonoscopy, pending biopsy results.  cc:  _______________________________ eSignedHart Carwin, MD 06/21/2012 9:10 AM Revised: 06/21/2012 9:10 AM    PATIENT NAME:  Kathleen Reyes, Kathleen Reyes MR#: [id number]

## 2012-06-24 ENCOUNTER — Telehealth: Payer: Self-pay

## 2012-06-24 NOTE — Telephone Encounter (Signed)
Left message on answering machine. 

## 2012-06-27 ENCOUNTER — Encounter: Payer: Self-pay | Admitting: Internal Medicine

## 2012-10-03 ENCOUNTER — Other Ambulatory Visit: Payer: Self-pay | Admitting: Otolaryngology

## 2012-10-03 DIAGNOSIS — E236 Other disorders of pituitary gland: Secondary | ICD-10-CM

## 2012-10-11 ENCOUNTER — Ambulatory Visit
Admission: RE | Admit: 2012-10-11 | Discharge: 2012-10-11 | Disposition: A | Discharge status: Home / Self Care | Payer: 59 | Source: Ambulatory Visit | Attending: Otolaryngology | Admitting: Otolaryngology

## 2012-10-11 DIAGNOSIS — E236 Other disorders of pituitary gland: Secondary | ICD-10-CM

## 2012-10-11 MED ORDER — GADOBENATE DIMEGLUMINE 529 MG/ML IV SOLN
7.0000 mL | Freq: Once | INTRAVENOUS | Status: AC | PRN
  Administered 2012-10-11: 7 mL via INTRAVENOUS

## 2013-02-26 ENCOUNTER — Encounter: Payer: Self-pay | Admitting: Nurse Practitioner

## 2013-02-28 ENCOUNTER — Encounter: Payer: Self-pay | Admitting: Nurse Practitioner

## 2013-02-28 ENCOUNTER — Ambulatory Visit (INDEPENDENT_AMBULATORY_CARE_PROVIDER_SITE_OTHER): Payer: 59 | Admitting: Nurse Practitioner

## 2013-02-28 VITALS — BP 130/76 | HR 76 | Ht 61.5 in | Wt 128.0 lb

## 2013-02-28 DIAGNOSIS — Z0000 Encounter for general adult medical examination without abnormal findings: Secondary | ICD-10-CM

## 2013-02-28 DIAGNOSIS — E559 Vitamin D deficiency, unspecified: Secondary | ICD-10-CM

## 2013-02-28 DIAGNOSIS — Z23 Encounter for immunization: Secondary | ICD-10-CM

## 2013-02-28 DIAGNOSIS — Z01419 Encounter for gynecological examination (general) (routine) without abnormal findings: Secondary | ICD-10-CM

## 2013-02-28 DIAGNOSIS — G43909 Migraine, unspecified, not intractable, without status migrainosus: Secondary | ICD-10-CM

## 2013-02-28 DIAGNOSIS — J45909 Unspecified asthma, uncomplicated: Secondary | ICD-10-CM

## 2013-02-28 DIAGNOSIS — R109 Unspecified abdominal pain: Secondary | ICD-10-CM

## 2013-02-28 LAB — CBC WITH DIFFERENTIAL/PLATELET
Basophils Absolute: 0 10*3/uL (ref 0.0–0.1)
Basophils Relative: 0 % (ref 0–1)
EOS ABS: 0.2 10*3/uL (ref 0.0–0.7)
Eosinophils Relative: 2 % (ref 0–5)
HCT: 34.9 % — ABNORMAL LOW (ref 36.0–46.0)
Hemoglobin: 11.9 g/dL — ABNORMAL LOW (ref 12.0–15.0)
LYMPHS PCT: 17 % (ref 12–46)
Lymphs Abs: 1.7 10*3/uL (ref 0.7–4.0)
MCH: 31.6 pg (ref 26.0–34.0)
MCHC: 34.1 g/dL (ref 30.0–36.0)
MCV: 92.8 fL (ref 78.0–100.0)
Monocytes Absolute: 0.5 10*3/uL (ref 0.1–1.0)
Monocytes Relative: 5 % (ref 3–12)
NEUTROS PCT: 76 % (ref 43–77)
Neutro Abs: 7.4 10*3/uL (ref 1.7–7.7)
PLATELETS: 236 10*3/uL (ref 150–400)
RBC: 3.76 MIL/uL — AB (ref 3.87–5.11)
RDW: 13.1 % (ref 11.5–15.5)
WBC: 9.7 10*3/uL (ref 4.0–10.5)

## 2013-02-28 LAB — POCT URINALYSIS DIPSTICK
Bilirubin, UA: NEGATIVE
Blood, UA: NEGATIVE
Glucose, UA: NEGATIVE
KETONES UA: NEGATIVE
LEUKOCYTES UA: NEGATIVE
NITRITE UA: NEGATIVE
PH UA: 6
PROTEIN UA: NEGATIVE
Urobilinogen, UA: NEGATIVE

## 2013-02-28 LAB — COMPREHENSIVE METABOLIC PANEL
ALBUMIN: 4.1 g/dL (ref 3.5–5.2)
ALT: 14 U/L (ref 0–35)
AST: 17 U/L (ref 0–37)
Alkaline Phosphatase: 48 U/L (ref 39–117)
BUN: 11 mg/dL (ref 6–23)
CO2: 22 mEq/L (ref 19–32)
Calcium: 9.3 mg/dL (ref 8.4–10.5)
Chloride: 106 mEq/L (ref 96–112)
Creat: 0.64 mg/dL (ref 0.50–1.10)
GLUCOSE: 151 mg/dL — AB (ref 70–99)
POTASSIUM: 4 meq/L (ref 3.5–5.3)
Sodium: 139 mEq/L (ref 135–145)
TOTAL PROTEIN: 6.7 g/dL (ref 6.0–8.3)
Total Bilirubin: 0.3 mg/dL (ref 0.2–1.2)

## 2013-02-28 LAB — HEMOGLOBIN, FINGERSTICK: HEMOGLOBIN, FINGERSTICK: 11.9 g/dL — AB (ref 12.0–16.0)

## 2013-02-28 MED ORDER — FLUCONAZOLE 150 MG PO TABS: 150.0000 mg | ORAL_TABLET | Freq: Once | ORAL | Status: DC

## 2013-02-28 MED ORDER — ESTRADIOL 10 MCG VA TABS: 10.0000 ug | ORAL_TABLET | VAGINAL | Status: DC

## 2013-02-28 MED ORDER — ESTRADIOL 0.06 MG/24HR TD PTWK: 1.0000 | MEDICATED_PATCH | TRANSDERMAL | Status: DC

## 2013-02-28 NOTE — Progress Notes (Signed)
Patient ID: Kathleen Reyes, female   DOB: 02-08-1950, 63 y.o.   MRN: [id number] 63 y.o. G74P4 Married Caucasian Fe here for annual exam.  Doing well and traveling a lot.  No recent episodes of GI bleed.  She does have a mid left abdominal 'pulsating' feeling X 2 days.  She describes it as though being pregnant and feeling a 'baby move'. It seems worse when laying on right side. Semi loose BM last Friday and normal since then without rectal bleeding.  Denies GERD but does take Protonix BID.  She did try GAS X and felt no improvement. She noted that these pulsations do come and go. She saw PCP a month ago and exam was normal.  No associated nausea/ vomiting, fever/ chills.  No LMP recorded. Patient has had a hysterectomy.          Sexually active: yes  The current method of family planning is post menopausal status.    Exercising: yes  Home exercise routine includes walking and doing aerobics in hotel room. Pt travels for work.. Smoker:  no  Health Maintenance: Pap:  01/21/08, WNL (TAH) MMG:  04/19/12, Bi-Rads 1: negative Colonoscopy:  2014, polyp, repeat in 5 years BMD:  04/19/12, 0.5/-1.0 TDaP:  ? 2004 Shingles: need to get Labs:  HB:  11.9 Urine:  Negative, pH 6.0   reports that she has never smoked. She has never used smokeless tobacco. She reports that she drinks about 1.8 ounces of alcohol per week. She reports that she does not use illicit drugs.  Past Medical History  Diagnosis Date  . Asthma   . Migraines   . GERD (gastroesophageal reflux disease)   . Ulcer   . Bleeding stomach ulcer x4    1989 & 2009, 2011  . Heart murmur   . H/O renal calculi     Past Surgical History  Procedure Laterality Date  . Knee surgery Right 2011 & one other scope  . Elbow surgery Left about 2004  . Upper gastrointestinal endoscopy    . Abdominal hysterectomy  1982    BSO, secondary to endometriosis    Current Outpatient Prescriptions  Medication Sig Dispense Refill  . Calcium  Carb-Cholecalciferol (CALCIUM-VITAMIN D) 600-400 MG-UNIT TABS Take 1 tablet by mouth daily.      . Cyanocobalamin (VITAMIN B 12 PO) Take 1 tablet by mouth daily.      . cycloSPORINE (RESTASIS) 0.05 % ophthalmic emulsion Place 2 drops into both eyes 2 (two) times daily.      Marland Kitchen eletriptan (RELPAX) 40 MG tablet One tablet by mouth at onset of headache. May repeat in 2 hours if headache persists or recurs. may repeat in 2 hours if necessary      . estradiol (CLIMARA) 0.06 MG/24HR Place 1 patch onto the skin once a week.  12 patch  3  . Estradiol (VAGIFEM) 10 MCG TABS vaginal tablet Place 1 tablet (10 mcg total) vaginally 2 (two) times a week.  24 tablet  3  . fluticasone (FLONASE) 50 MCG/ACT nasal spray Place 2 sprays into both nostrils daily.      . Fluticasone-Salmeterol (ADVAIR) 100-50 MCG/DOSE AEPB Inhale 1 puff into the lungs every 12 (twelve) hours.      . pantoprazole (PROTONIX) 40 MG tablet Take 40 mg by mouth daily.      Marland Kitchen PROAIR HFA 108 (90 BASE) MCG/ACT inhaler Inhale 2 puffs into the lungs as needed.      . topiramate (TOPAMAX) 25 MG tablet Take  25 mg by mouth 2 (two) times daily.      . fluconazole (DIFLUCAN) 150 MG tablet Take 1 tablet (150 mg total) by mouth once. Take one tablet.  Repeat in 48 hours if symptoms are not completely resolved.  2 tablet  0   No current facility-administered medications for this visit.    Family History  Problem Relation Age of Onset  . Colon cancer Father 37  . Hypertension Father   . Diabetes Father   . Hypertension Mother   . Hyperlipidemia Mother   . Diabetes Brother   . Hypertension Brother   . Hyperlipidemia Brother     ROS:  Pertinent items are noted in HPI.  Otherwise, a comprehensive ROS was negative.  Exam:   BP 130/76  Pulse 76  Ht 5' 1.5" (1.562 m)  Wt 128 lb (58.06 kg)  BMI 23.80 kg/m2 Height: 5' 1.5" (156.2 cm)  Ht Readings from Last 3 Encounters:  02/28/13 5' 1.5" (1.562 m)  06/21/12 5\' 2"  (1.575 m)  06/03/12 5\' 2"  (1.575  m)    General appearance: alert, cooperative and appears stated age Head: Normocephalic, without obvious abnormality, atraumatic Neck: no adenopathy, supple, symmetrical, trachea midline and thyroid normal to inspection and palpation Lungs: clear to auscultation bilaterally Breasts: normal appearance, no masses or tenderness Heart: regular rate and rhythm Abdomen: soft, non-tender; no masses,  no organomegaly, increased bowel sounds in all quadrants, no abdominal aneurism is heard or felt, no surgical abdominal signs or rebounding Extremities: extremities normal, atraumatic, no cyanosis or edema Skin: Skin color, texture, turgor normal. No rashes or lesions Lymph nodes: Cervical, supraclavicular, and axillary nodes normal. No abnormal inguinal nodes palpated Neurologic: Grossly normal   Pelvic: External genitalia:  no lesions              Urethra:  normal appearing urethra with no masses, tenderness or lesions              Bartholin's and Skene's: normal                 Vagina: normal appearing vagina with normal color and discharge, no lesions              Cervix: absent              Pap taken: no Bimanual Exam:  Uterus:  uterus absent              Adnexa: no mass, fullness, tenderness               Rectovaginal: Confirms               Anus:  normal sphincter tone, no lesions  A:  Well Woman with normal exam  S/P TAH/BSO secondary to endometriosis on ERT  Atrophic vaginitis better on vaginal E  Abdominal discomfort ? Etiology  History of migraine headaches  Immunization update  P:   Pap smear as per guidelines   Mammogram due 3/15  Refill Vagifem and Climara  Counseled on risk with CVA,DVT,cancer, etc.  TDaP given today  Will follow with labs  Counseled on breast self exam, mammography screening, adequate intake of calcium and vitamin D, diet and exercise, Kegel's exercises return annually or prn  An After Visit Summary was printed and given to the patient.

## 2013-02-28 NOTE — Patient Instructions (Addendum)

## 2013-03-01 LAB — HEMOGLOBIN A1C
Hgb A1c MFr Bld: 5.9 % — ABNORMAL HIGH (ref <5.7)
Mean Plasma Glucose: 123 mg/dL — ABNORMAL HIGH (ref <117)

## 2013-03-01 LAB — TSH: TSH: 1.443 u[IU]/mL (ref 0.350–4.500)

## 2013-03-01 LAB — VITAMIN D 25 HYDROXY (VIT D DEFICIENCY, FRACTURES): Vit D, 25-Hydroxy: 46 ng/mL (ref 30–89)

## 2013-03-02 ENCOUNTER — Telehealth: Payer: Self-pay | Admitting: Nurse Practitioner

## 2013-03-02 NOTE — Progress Notes (Signed)
Encounter reviewed by Dr. Josefa Half. If abdominal pain persists, will do CT scan of abdomen. If acute abdomen develops, to ER department.

## 2013-03-03 NOTE — Telephone Encounter (Signed)
I called to give patient her results and no answer.

## 2013-06-02 NOTE — Addendum Note (Signed)
Addended by: Kem Boroughs R on: 06/02/2013 09:05 AM   Modules accepted: Level of Service

## 2013-11-17 ENCOUNTER — Other Ambulatory Visit: Payer: Self-pay

## 2013-11-17 DIAGNOSIS — Z1231 Encounter for screening mammogram for malignant neoplasm of breast: Secondary | ICD-10-CM

## 2013-11-24 ENCOUNTER — Encounter: Payer: Self-pay | Admitting: Nurse Practitioner

## 2013-12-02 ENCOUNTER — Ambulatory Visit
Admission: RE | Admit: 2013-12-02 | Discharge: 2013-12-02 | Disposition: A | Discharge status: Home / Self Care | Payer: 59 | Source: Ambulatory Visit

## 2013-12-02 DIAGNOSIS — Z1231 Encounter for screening mammogram for malignant neoplasm of breast: Secondary | ICD-10-CM

## 2014-02-04 ENCOUNTER — Encounter: Payer: Self-pay | Admitting: Nurse Practitioner

## 2014-02-04 ENCOUNTER — Telehealth: Payer: Self-pay | Admitting: Nurse Practitioner

## 2014-02-04 ENCOUNTER — Ambulatory Visit (INDEPENDENT_AMBULATORY_CARE_PROVIDER_SITE_OTHER): Payer: 59 | Admitting: Nurse Practitioner

## 2014-02-04 VITALS — BP 120/74 | HR 76 | Wt 139.0 lb

## 2014-02-04 DIAGNOSIS — N760 Acute vaginitis: Secondary | ICD-10-CM

## 2014-02-04 DIAGNOSIS — R591 Generalized enlarged lymph nodes: Secondary | ICD-10-CM

## 2014-02-04 LAB — CBC WITH DIFFERENTIAL/PLATELET
BASOS ABS: 0 10*3/uL (ref 0.0–0.1)
Basophils Relative: 0 % (ref 0–1)
Eosinophils Absolute: 0.2 10*3/uL (ref 0.0–0.7)
Eosinophils Relative: 2 % (ref 0–5)
HCT: 36.8 % (ref 36.0–46.0)
Hemoglobin: 12.4 g/dL (ref 12.0–15.0)
LYMPHS PCT: 31 % (ref 12–46)
Lymphs Abs: 2.5 10*3/uL (ref 0.7–4.0)
MCH: 31.6 pg (ref 26.0–34.0)
MCHC: 33.7 g/dL (ref 30.0–36.0)
MCV: 93.6 fL (ref 78.0–100.0)
MPV: 11.2 fL (ref 8.6–12.4)
Monocytes Absolute: 0.6 10*3/uL (ref 0.1–1.0)
Monocytes Relative: 7 % (ref 3–12)
NEUTROS ABS: 4.9 10*3/uL (ref 1.7–7.7)
Neutrophils Relative %: 60 % (ref 43–77)
PLATELETS: 276 10*3/uL (ref 150–400)
RBC: 3.93 MIL/uL (ref 3.87–5.11)
RDW: 14 % (ref 11.5–15.5)
WBC: 8.2 10*3/uL (ref 4.0–10.5)

## 2014-02-04 NOTE — Telephone Encounter (Signed)
Patient calling requesting to be seen as soon as soon as possible for a "vaginal infection." She says she is in "quite a bit of pain that has been getting progressively worse over the last few days, especially at night."

## 2014-02-04 NOTE — Telephone Encounter (Signed)
Patient requests office visit today due to pain in vaginal area. Office visit today at 1615 with Milford Cage, [place] . Patient agreeable. Routing to provider for final review. Patient agreeable to disposition. Will close encounter

## 2014-02-04 NOTE — Progress Notes (Signed)
64 y.o. WM Fe G4, P4 here with complaint of vaginal symptoms of vaginal pain, burning, and increase discharge. Describes discharge as yellow.  She describes that about 2 months ago had a swollen area right groin and sought care at PCP.  She was told she has very swollen lymph nodes and given antibiotics for week.  The plan was that if symptoms not better would send her here for treatment.  It took some time but the enlarged and tender area within the labia and groin did improve.  She now comes in because of increase discharge and persistent vaginal pain on the right.  She denies fever and chills.  She is postmenopausal and post hysterectomy and treats atrophic vaginitis with Vagifem.  Denies urinary symptoms.  No STD concerns.  Denies new personal products  .   O: Healthy female WDWN Affect: normal, orientation x 3  Exam: NAD Abdomen: soft and non tender Lymph node: no enlargement but tenderness on the right inguinal Pelvic exam: External genital:  BUS: no obvious bartholin gland cyst but within the right labia is a cyst structure that is tender and she identifies this as the area that is painful. Vagina: thin white discharge noted. Affirm is done Adnexa:normal, non tender, no masses or fullness noted Consult and exam with Dr. Quincy Simmonds as well - this could be a labial cyst vs. a higher Bartholin gland cyst.  A: Right labial cyst vs. Bartholin gland cyst  Postmenopausal currently treating atrophic vaginitis with Vagifem  R/O other vaginitis   P: Discussed findings of cyst and etiology. Discussed Aveeno or baking soda sitz bath for comfort. Avoid moist clothes or pads for extended period of time. If working out in gym clothes or swim suits for long periods of time change underwear or bottoms of swimsuit if possible. Olive Oil use for skin protection prior to activity can be used to external skin.  Rx: none at this time  May need future I & D  Will recheck at [facility] on 03/13/14 and Dr. Quincy Simmonds to see as  well.  RV prn

## 2014-02-04 NOTE — Patient Instructions (Signed)
Epidermal Cyst An epidermal cyst is sometimes called a sebaceous cyst, epidermal inclusion cyst, or infundibular cyst. These cysts usually contain a substance that looks "pasty" or "cheesy" and may have a bad smell. This substance is a protein called keratin. Epidermal cysts are usually found on the face, neck, or trunk. They may also occur in the vaginal area or other parts of the genitalia of both men and women. Epidermal cysts are usually small, painless, slow-growing bumps or lumps that move freely under the skin. It is important not to try to pop them. This may cause an infection and lead to tenderness and swelling. CAUSES  Epidermal cysts may be caused by a deep penetrating injury to the skin or a plugged hair follicle, often associated with acne. SYMPTOMS  Epidermal cysts can become inflamed and cause:  Redness.  Tenderness.  Increased temperature of the skin over the bumps or lumps.  Grayish-white, bad smelling material that drains from the bump or lump. DIAGNOSIS  Epidermal cysts are easily diagnosed by your caregiver during an exam. Rarely, a tissue sample (biopsy) may be taken to rule out other conditions that may resemble epidermal cysts. TREATMENT   Epidermal cysts often get better and disappear on their own. They are rarely ever cancerous.  If a cyst becomes infected, it may become inflamed and tender. This may require opening and draining the cyst. Treatment with antibiotics may be necessary. When the infection is gone, the cyst may be removed with minor surgery.  Small, inflamed cysts can often be treated with antibiotics or by injecting steroid medicines.  Sometimes, epidermal cysts become large and bothersome. If this happens, surgical removal in your caregiver's office may be necessary. HOME CARE INSTRUCTIONS  Only take over-the-counter or prescription medicines as directed by your caregiver.  Take your antibiotics as directed. Finish them even if you start to feel  better. SEEK MEDICAL CARE IF:   Your cyst becomes tender, red, or swollen.  Your condition is not improving or is getting worse.  You have any other questions or concerns. MAKE SURE YOU:  Understand these instructions.  Will watch your condition.  Will get help right away if you are not doing well or get worse. Document Released: 12/11/2003 Document Revised: 04/03/2011 Document Reviewed: 07/18/2010 Exit[employer] Patient Information 2015 [employer]. This information is not intended to replace advice given to you by your health care provider. Make sure you discuss any questions you have with your health care provider.  

## 2014-02-05 ENCOUNTER — Other Ambulatory Visit: Payer: Self-pay | Admitting: Nurse Practitioner

## 2014-02-05 LAB — WET PREP BY MOLECULAR PROBE
CANDIDA SPECIES: POSITIVE — AB
Gardnerella vaginalis: NEGATIVE
TRICHOMONAS VAG: NEGATIVE

## 2014-02-05 MED ORDER — FLUCONAZOLE 150 MG PO TABS: 150.0000 mg | ORAL_TABLET | Freq: Once | ORAL | Status: DC

## 2014-02-08 NOTE — Progress Notes (Signed)
Encounter reviewed by Dr. Josefa Half.  Patient personally examined.  No evidence of abscess today.

## 2014-03-13 ENCOUNTER — Ambulatory Visit: Payer: 59 | Admitting: Nurse Practitioner

## 2014-04-16 ENCOUNTER — Telehealth: Payer: Self-pay | Admitting: Nurse Practitioner

## 2014-04-16 NOTE — Telephone Encounter (Signed)
Call to patient. Last visit 02/04/14 with Milford Cage, FNP and dx with Yeast via Affirm. Also possible Right labial cyst vs. Bartholin gland cyst. Patient cancelled Annual exam scheduled for follow up 03/13/14 and it was planned for patient to be examined by Dr. Quincy Simmonds at that time.  Patient states that pain continues in vaginal area with ongoing discharge. Unsure if r/t ongoing yeast or possible cyst. States symptoms never quite resolved after appointment. Denies fevers. States she has intermittent RLQ pain at night while laying in bed and states this has been ongoing since January as well.   Patient is offered appointment with MD for evaluation. She is agreeable but requests 04/24/14 only due to travel for work schedule. She declines any earlier appointment. Scheduled per patient request for 04/24/14 at 1530 with Dr. Sabra Heck. Patient is advised to call back if any worsening of symptoms or if desirous of making earlier appointment. She verbalized understanding.   Routing to provider for final review. Patient agreeable to disposition. Will close encounter

## 2014-04-16 NOTE — Telephone Encounter (Signed)
Saw patty recently for vaginal/abdominal pain. Dx with yeast infection. States pain increasing and pt is worried. Pt states mostly on right side. Would like appointment.  Chart to triage

## 2014-04-24 ENCOUNTER — Ambulatory Visit: Payer: 59 | Admitting: Obstetrics & Gynecology

## 2015-11-16 ENCOUNTER — Other Ambulatory Visit: Payer: Self-pay | Admitting: Family Medicine

## 2015-11-16 DIAGNOSIS — Z1231 Encounter for screening mammogram for malignant neoplasm of breast: Secondary | ICD-10-CM

## 2015-11-18 ENCOUNTER — Other Ambulatory Visit: Payer: Self-pay | Admitting: Family Medicine

## 2015-11-18 DIAGNOSIS — R0981 Nasal congestion: Secondary | ICD-10-CM

## 2015-11-19 ENCOUNTER — Other Ambulatory Visit: Payer: Self-pay | Admitting: Family Medicine

## 2015-11-19 ENCOUNTER — Ambulatory Visit
Admission: RE | Admit: 2015-11-19 | Discharge: 2015-11-19 | Disposition: A | Discharge status: Home / Self Care | Payer: 59 | Source: Ambulatory Visit | Attending: Family Medicine | Admitting: Family Medicine

## 2015-11-19 DIAGNOSIS — R0981 Nasal congestion: Secondary | ICD-10-CM

## 2016-01-21 ENCOUNTER — Ambulatory Visit
Admission: RE | Admit: 2016-01-21 | Discharge: 2016-01-21 | Disposition: A | Discharge status: Home / Self Care | Payer: 59 | Source: Ambulatory Visit | Attending: Family Medicine | Admitting: Family Medicine

## 2016-01-21 DIAGNOSIS — Z1231 Encounter for screening mammogram for malignant neoplasm of breast: Secondary | ICD-10-CM

## 2017-06-05 ENCOUNTER — Other Ambulatory Visit: Payer: Self-pay | Admitting: Family Medicine

## 2017-06-05 ENCOUNTER — Ambulatory Visit
Admission: RE | Admit: 2017-06-05 | Discharge: 2017-06-05 | Disposition: A | Discharge status: Home / Self Care | Payer: BLUE CROSS/BLUE SHIELD | Source: Ambulatory Visit | Attending: Family Medicine | Admitting: Family Medicine

## 2017-06-05 DIAGNOSIS — J45901 Unspecified asthma with (acute) exacerbation: Secondary | ICD-10-CM

## 2017-06-05 DIAGNOSIS — J069 Acute upper respiratory infection, unspecified: Secondary | ICD-10-CM

## 2017-06-12 ENCOUNTER — Encounter: Payer: Self-pay | Admitting: Gastroenterology

## 2017-09-03 ENCOUNTER — Ambulatory Visit: Payer: 59 | Admitting: Allergy and Immunology

## 2017-09-24 ENCOUNTER — Emergency Department (HOSPITAL_COMMUNITY)
Admission: EM | Admit: 2017-09-24 | Discharge: 2017-09-24 | Disposition: A | Discharge status: Home / Self Care | Payer: BLUE CROSS/BLUE SHIELD | Attending: Emergency Medicine | Admitting: Emergency Medicine

## 2017-09-24 ENCOUNTER — Encounter (HOSPITAL_COMMUNITY): Payer: Self-pay

## 2017-09-24 ENCOUNTER — Emergency Department (HOSPITAL_COMMUNITY): Payer: BLUE CROSS/BLUE SHIELD

## 2017-09-24 ENCOUNTER — Other Ambulatory Visit: Payer: Self-pay

## 2017-09-24 DIAGNOSIS — G43901 Migraine, unspecified, not intractable, with status migrainosus: Secondary | ICD-10-CM | POA: Insufficient documentation

## 2017-09-24 DIAGNOSIS — R0789 Other chest pain: Secondary | ICD-10-CM | POA: Diagnosis not present

## 2017-09-24 DIAGNOSIS — I10 Essential (primary) hypertension: Secondary | ICD-10-CM | POA: Diagnosis not present

## 2017-09-24 DIAGNOSIS — J45909 Unspecified asthma, uncomplicated: Secondary | ICD-10-CM | POA: Diagnosis not present

## 2017-09-24 DIAGNOSIS — Z79899 Other long term (current) drug therapy: Secondary | ICD-10-CM | POA: Insufficient documentation

## 2017-09-24 DIAGNOSIS — R51 Headache: Secondary | ICD-10-CM | POA: Diagnosis present

## 2017-09-24 LAB — BASIC METABOLIC PANEL
ANION GAP: 14 (ref 5–15)
BUN: 7 mg/dL — ABNORMAL LOW (ref 8–23)
CHLORIDE: 105 mmol/L (ref 98–111)
CO2: 19 mmol/L — AB (ref 22–32)
CREATININE: 0.57 mg/dL (ref 0.44–1.00)
Calcium: 9.1 mg/dL (ref 8.9–10.3)
GFR calc non Af Amer: >60 mL/min (ref >60)
Glucose, Bld: 129 mg/dL — ABNORMAL HIGH (ref 70–99)
Potassium: 3.7 mmol/L (ref 3.5–5.1)
SODIUM: 138 mmol/L (ref 135–145)

## 2017-09-24 LAB — CBC
HCT: 37.2 % (ref 36.0–46.0)
HEMOGLOBIN: 12.8 g/dL (ref 12.0–15.0)
MCH: 31.4 pg (ref 26.0–34.0)
MCHC: 34.4 g/dL (ref 30.0–36.0)
MCV: 91.4 fL (ref 78.0–100.0)
PLATELETS: 315 10*3/uL (ref 150–400)
RBC: 4.07 MIL/uL (ref 3.87–5.11)
RDW: 13 % (ref 11.5–15.5)
WBC: 10.2 10*3/uL (ref 4.0–10.5)

## 2017-09-24 LAB — I-STAT TROPONIN, ED: Troponin i, poc: 0 ng/mL (ref 0.00–0.08)

## 2017-09-24 MED ORDER — DEXAMETHASONE SODIUM PHOSPHATE 10 MG/ML IJ SOLN
10.0000 mg | Freq: Once | INTRAMUSCULAR | Status: AC
  Administered 2017-09-24: 10 mg via INTRAVENOUS
  Filled 2017-09-24: qty 1

## 2017-09-24 MED ORDER — SODIUM CHLORIDE 0.9 % IV BOLUS
1000.0000 mL | Freq: Once | INTRAVENOUS | Status: AC
  Administered 2017-09-24: 1000 mL via INTRAVENOUS

## 2017-09-24 MED ORDER — KETOROLAC TROMETHAMINE 30 MG/ML IJ SOLN
30.0000 mg | Freq: Once | INTRAMUSCULAR | Status: AC
  Administered 2017-09-24: 30 mg via INTRAVENOUS
  Filled 2017-09-24: qty 1

## 2017-09-24 MED ORDER — DIPHENHYDRAMINE HCL 50 MG/ML IJ SOLN
25.0000 mg | Freq: Once | INTRAMUSCULAR | Status: AC
  Administered 2017-09-24: 25 mg via INTRAVENOUS
  Filled 2017-09-24: qty 1

## 2017-09-24 MED ORDER — METOCLOPRAMIDE HCL 5 MG/ML IJ SOLN
10.0000 mg | Freq: Once | INTRAMUSCULAR | Status: AC
  Administered 2017-09-24: 10 mg via INTRAVENOUS
  Filled 2017-09-24: qty 2

## 2017-09-24 NOTE — ED Notes (Signed)
Patient transported to X-ray 

## 2017-09-24 NOTE — ED Provider Notes (Signed)
[facility] DEPT Provider Note   CSN: [id number] Arrival date & time: 09/24/17  0226     History   Chief Complaint Chief Complaint  Patient presents with  . Migraine  . Chest Pain    HPI Kathleen Reyes is a 67 y.o. female.  Patient is a 67 year old female with past medical history of migraines, hypertension, GERD.  She presents for evaluation of headache.  This is been ongoing for the past day and is rapidly worsening.  She has been feeling stressed over an upcoming move and selling her house here locally and believes that this is the cause.  She is also had more caffeine today than she normally drinks.  She also reports feeling unsteady from her headache and fell in the bathroom and her left arm has felt numb since.  She also reports intermittent chest pains over the past week that she attributes to stress.  She reports pressure in her chest with no nausea, diaphoresis, shortness of breath, or radiation.  She denies any exertional component.  The history is provided by the patient.  Migraine  This is a recurrent problem. The current episode started 3 to 5 hours ago. The problem occurs constantly. The problem has been rapidly worsening. Nothing aggravates the symptoms. Nothing relieves the symptoms. She has tried nothing for the symptoms.    Past Medical History:  Diagnosis Date  . Asthma   . Bleeding stomach ulcer x4   1989 & 2009, 2011  . GERD (gastroesophageal reflux disease)   . H/O renal calculi   . Heart murmur   . Migraines   . Ulcer     Patient Active Problem List   Diagnosis Date Noted  . ANEMIA, SECONDARY TO BLOOD LOSS 03/11/2008  . ANXIETY 03/11/2008  . HYPERTENSION 03/11/2008  . ASTHMA 03/11/2008  . ESOPHAGEAL MOTILITY DISORDER 03/11/2008  . GI BLEEDING 03/11/2008  . CEREBROVASCULAR ACCIDENT, HX OF 03/11/2008  . Personal history of peptic ulcer disease 03/11/2008  . MIGRAINES, HX OF 03/11/2008    Past Surgical History:    Procedure Laterality Date  . ABDOMINAL HYSTERECTOMY  1982   BSO, secondary to endometriosis  . ELBOW SURGERY Left about 2004  . KNEE SURGERY Right 2011 & one other scope  . UPPER GASTROINTESTINAL ENDOSCOPY       OB History    Gravida  4   Para  4   Term      Preterm      AB      Living  3     SAB      TAB      Ectopic      Multiple      Live Births  3            Home Medications    Prior to Admission medications   Medication Sig Start Date End Date Taking? Authorizing Provider  Calcium Carb-Cholecalciferol (CALCIUM-VITAMIN D) 600-400 MG-UNIT TABS Take 1 tablet by mouth daily.    [provider]  Cyanocobalamin (VITAMIN B 12 PO) Take 1 tablet by mouth daily.    [provider]  cycloSPORINE (RESTASIS) 0.05 % ophthalmic emulsion Place 2 drops into both eyes 2 (two) times daily.    [provider]  eletriptan (RELPAX) 40 MG tablet One tablet by mouth at onset of headache. May repeat in 2 hours if headache persists or recurs. may repeat in 2 hours if necessary    [provider]  estradiol (CLIMARA) 0.06  MG/24HR Place 1 patch onto the skin once a week. 02/28/13   Kem Boroughs, FNP  Estradiol (VAGIFEM) 10 MCG TABS vaginal tablet Place 1 tablet (10 mcg total) vaginally 2 (two) times a week. 02/28/13   Kem Boroughs, FNP  fluconazole (DIFLUCAN) 150 MG tablet Take 1 tablet (150 mg total) by mouth once. Take one tablet.  Repeat in 48 hours if symptoms are not completely resolved. 02/05/14   Kem Boroughs, FNP  fluticasone (FLONASE) 50 MCG/ACT nasal spray Place 2 sprays into both nostrils daily. 02/09/13   [provider]  Fluticasone-Salmeterol (ADVAIR) 100-50 MCG/DOSE AEPB Inhale 1 puff into the lungs every 12 (twelve) hours.    [provider]  pantoprazole (PROTONIX) 40 MG tablet Take 40 mg by mouth daily.    [provider]  PROAIR HFA 108 (90 BASE) MCG/ACT inhaler Inhale 2 puffs into the lungs as needed.  01/03/13   [provider]  topiramate (TOPAMAX) 25 MG tablet Take 25 mg by mouth 2 (two) times daily.    [provider]    Family History Family History  Problem Relation Age of Onset  . Colon cancer Father 72  . Hypertension Father   . Diabetes Father   . Hypertension Mother   . Hyperlipidemia Mother   . Diabetes Brother   . Hypertension Brother   . Hyperlipidemia Brother     Social History Social History   Tobacco Use  . Smoking status: Never Smoker  . Smokeless tobacco: Never Used  Substance Use Topics  . Alcohol use: Yes    Alcohol/week: 3.0 standard drinks    Types: 1 Glasses of wine, 2 Cans of beer per week  . Drug use: No     Allergies   Sulfonamide derivatives   Review of Systems Review of Systems  All other systems reviewed and are negative.    Physical Exam Updated Vital Signs BP (!) 152/109 (BP Location: Right Arm)   Pulse 100   Temp 98 F (36.7 C) (Oral)   Resp 20   SpO2 97%   Physical Exam  Constitutional: She is oriented to person, place, and time. She appears well-developed and well-nourished. No distress.  HENT:  Head: Normocephalic and atraumatic.  Eyes: Pupils are equal, round, and reactive to light. EOM are normal.  Neck: Normal range of motion. Neck supple.  Cardiovascular: Normal rate and regular rhythm. Exam reveals no gallop and no friction rub.  No murmur heard. Pulmonary/Chest: Effort normal and breath sounds normal. No respiratory distress. She has no wheezes.  Abdominal: Soft. Bowel sounds are normal. She exhibits no distension. There is no tenderness.  Musculoskeletal: Normal range of motion.  Neurological: She is alert and oriented to person, place, and time. She is not disoriented. No cranial nerve deficit.  Skin: Skin is warm and dry. She is not diaphoretic.  Nursing note and vitals reviewed.    ED Treatments / Results  Labs (all labs ordered are listed, but only abnormal results are  displayed) Labs Reviewed  CBC  BASIC METABOLIC PANEL  I-STAT TROPONIN, ED    EKG None  Radiology No results found.  Procedures Procedures (including critical care time)  Medications Ordered in ED Medications  ketorolac (TORADOL) 30 MG/ML injection 30 mg (has no administration in time range)  sodium chloride 0.9 % bolus 1,000 mL (has no administration in time range)  diphenhydrAMINE (BENADRYL) injection 25 mg (has no administration in time range)  dexamethasone (DECADRON) injection 10 mg (has no administration in time  range)  metoCLOPramide (REGLAN) injection 10 mg (has no administration in time range)     Initial Impression / Assessment and Plan / ED Course  I have reviewed the triage vital signs and the nursing notes.  Pertinent labs & imaging results that were available during my care of the patient were reviewed by me and considered in my medical decision making (see chart for details).  Patient presents with headache that started earlier today.  She has a history of migraines and believes that increased stress with an upcoming move has been the trigger.  Her neurologic exam is nonfocal and she is feeling much better after a migraine cocktail.  I feel as though she is stable for discharge from a neurologic standpoint.  She also reports falling in the bathroom due to her headache.  She reports some pain in her chest after falling.  This appears musculoskeletal as her EKG and troponin are negative.  I see no reason for further work-up into this.  She will be discharged and is to follow-up as needed.  Final Clinical Impressions(s) / ED Diagnoses   Final diagnoses:  None    ED Discharge Orders    None       Veryl Speak, MD 09/24/17 [phone]

## 2017-09-24 NOTE — ED Triage Notes (Addendum)
Pt reports a migraine that started around last night before bed. She has a hx of same. She also reports falling in to the bathtub tonight while getting up to urinate. She attributes the fall to disorientation from the migraine. She also states on the way here, she started experiencing chest pain that she attributes to caffeine and hyperventilation. States that over the past week she has experienced the same chest pain. Throughout triage, pt endorses "waves" of head pain that cause her to stop mid-sentence. A&Ox4. No slurred speech or facial droop.

## 2017-09-24 NOTE — Discharge Instructions (Addendum)
Return to the emergency department if you experience any new and concerning symptoms.

## 2017-10-01 ENCOUNTER — Ambulatory Visit: Payer: 59 | Admitting: Allergy and Immunology

## 2020-03-26 DIAGNOSIS — M25511 Pain in right shoulder: Secondary | ICD-10-CM | POA: Diagnosis not present

## 2020-03-27 DIAGNOSIS — M25512 Pain in left shoulder: Secondary | ICD-10-CM | POA: Diagnosis not present

## 2020-04-05 DIAGNOSIS — M25511 Pain in right shoulder: Secondary | ICD-10-CM | POA: Diagnosis not present

## 2020-04-05 DIAGNOSIS — M25512 Pain in left shoulder: Secondary | ICD-10-CM | POA: Diagnosis not present

## 2020-04-13 DIAGNOSIS — Z0000 Encounter for general adult medical examination without abnormal findings: Secondary | ICD-10-CM | POA: Diagnosis not present

## 2020-04-13 DIAGNOSIS — G479 Sleep disorder, unspecified: Secondary | ICD-10-CM | POA: Diagnosis not present

## 2020-04-13 DIAGNOSIS — R7303 Prediabetes: Secondary | ICD-10-CM | POA: Diagnosis not present

## 2020-04-13 DIAGNOSIS — K279 Peptic ulcer, site unspecified, unspecified as acute or chronic, without hemorrhage or perforation: Secondary | ICD-10-CM | POA: Diagnosis not present

## 2020-04-13 DIAGNOSIS — E538 Deficiency of other specified B group vitamins: Secondary | ICD-10-CM | POA: Diagnosis not present

## 2020-04-13 DIAGNOSIS — J309 Allergic rhinitis, unspecified: Secondary | ICD-10-CM | POA: Diagnosis not present

## 2020-04-13 DIAGNOSIS — I7300 Raynaud's syndrome without gangrene: Secondary | ICD-10-CM | POA: Diagnosis not present

## 2020-04-13 DIAGNOSIS — I10 Essential (primary) hypertension: Secondary | ICD-10-CM | POA: Diagnosis not present

## 2020-04-13 DIAGNOSIS — G9050 Complex regional pain syndrome I, unspecified: Secondary | ICD-10-CM | POA: Diagnosis not present

## 2020-04-13 DIAGNOSIS — J4520 Mild intermittent asthma, uncomplicated: Secondary | ICD-10-CM | POA: Diagnosis not present

## 2020-04-15 ENCOUNTER — Other Ambulatory Visit: Payer: Self-pay | Admitting: Family Medicine

## 2020-04-15 DIAGNOSIS — E2839 Other primary ovarian failure: Secondary | ICD-10-CM

## 2020-04-15 DIAGNOSIS — Z1231 Encounter for screening mammogram for malignant neoplasm of breast: Secondary | ICD-10-CM

## 2020-04-27 DIAGNOSIS — M94212 Chondromalacia, left shoulder: Secondary | ICD-10-CM | POA: Diagnosis not present

## 2020-04-27 DIAGNOSIS — G8918 Other acute postprocedural pain: Secondary | ICD-10-CM | POA: Diagnosis not present

## 2020-04-27 DIAGNOSIS — S46012A Strain of muscle(s) and tendon(s) of the rotator cuff of left shoulder, initial encounter: Secondary | ICD-10-CM | POA: Diagnosis not present

## 2020-04-27 DIAGNOSIS — S43432A Superior glenoid labrum lesion of left shoulder, initial encounter: Secondary | ICD-10-CM | POA: Diagnosis not present

## 2020-05-05 DIAGNOSIS — M25512 Pain in left shoulder: Secondary | ICD-10-CM | POA: Diagnosis not present

## 2020-05-05 DIAGNOSIS — M7501 Adhesive capsulitis of right shoulder: Secondary | ICD-10-CM | POA: Diagnosis not present

## 2020-05-13 DIAGNOSIS — M25512 Pain in left shoulder: Secondary | ICD-10-CM | POA: Diagnosis not present

## 2020-05-26 DIAGNOSIS — M25512 Pain in left shoulder: Secondary | ICD-10-CM | POA: Diagnosis not present

## 2020-05-27 DIAGNOSIS — H524 Presbyopia: Secondary | ICD-10-CM | POA: Diagnosis not present

## 2020-05-27 DIAGNOSIS — H2513 Age-related nuclear cataract, bilateral: Secondary | ICD-10-CM | POA: Diagnosis not present

## 2020-06-11 DIAGNOSIS — J019 Acute sinusitis, unspecified: Secondary | ICD-10-CM | POA: Diagnosis not present

## 2020-06-11 DIAGNOSIS — R062 Wheezing: Secondary | ICD-10-CM | POA: Diagnosis not present

## 2020-06-11 DIAGNOSIS — Z20822 Contact with and (suspected) exposure to covid-19: Secondary | ICD-10-CM | POA: Diagnosis not present

## 2020-06-11 DIAGNOSIS — B9689 Other specified bacterial agents as the cause of diseases classified elsewhere: Secondary | ICD-10-CM | POA: Diagnosis not present

## 2020-06-11 DIAGNOSIS — R059 Cough, unspecified: Secondary | ICD-10-CM | POA: Diagnosis not present

## 2020-06-25 DIAGNOSIS — M25512 Pain in left shoulder: Secondary | ICD-10-CM | POA: Diagnosis not present

## 2020-07-05 DIAGNOSIS — M25512 Pain in left shoulder: Secondary | ICD-10-CM | POA: Diagnosis not present

## 2020-07-16 DIAGNOSIS — M25512 Pain in left shoulder: Secondary | ICD-10-CM | POA: Diagnosis not present

## 2020-07-22 DIAGNOSIS — M25512 Pain in left shoulder: Secondary | ICD-10-CM | POA: Diagnosis not present

## 2020-07-27 DIAGNOSIS — Z4889 Encounter for other specified surgical aftercare: Secondary | ICD-10-CM | POA: Diagnosis not present

## 2020-07-27 DIAGNOSIS — M7541 Impingement syndrome of right shoulder: Secondary | ICD-10-CM | POA: Diagnosis not present

## 2020-07-27 DIAGNOSIS — M19011 Primary osteoarthritis, right shoulder: Secondary | ICD-10-CM | POA: Diagnosis not present

## 2020-07-27 DIAGNOSIS — M25511 Pain in right shoulder: Secondary | ICD-10-CM | POA: Diagnosis not present

## 2020-07-27 DIAGNOSIS — M75121 Complete rotator cuff tear or rupture of right shoulder, not specified as traumatic: Secondary | ICD-10-CM | POA: Diagnosis not present

## 2020-07-27 DIAGNOSIS — M75101 Unspecified rotator cuff tear or rupture of right shoulder, not specified as traumatic: Secondary | ICD-10-CM | POA: Diagnosis not present

## 2020-07-27 DIAGNOSIS — S43491A Other sprain of right shoulder joint, initial encounter: Secondary | ICD-10-CM | POA: Diagnosis not present

## 2020-07-27 DIAGNOSIS — G8918 Other acute postprocedural pain: Secondary | ICD-10-CM | POA: Diagnosis not present

## 2020-07-27 DIAGNOSIS — S43431A Superior glenoid labrum lesion of right shoulder, initial encounter: Secondary | ICD-10-CM | POA: Diagnosis not present

## 2020-07-27 DIAGNOSIS — I890 Lymphedema, not elsewhere classified: Secondary | ICD-10-CM | POA: Diagnosis not present

## 2020-08-01 DIAGNOSIS — S43491A Other sprain of right shoulder joint, initial encounter: Secondary | ICD-10-CM | POA: Diagnosis not present

## 2020-08-01 DIAGNOSIS — M75121 Complete rotator cuff tear or rupture of right shoulder, not specified as traumatic: Secondary | ICD-10-CM | POA: Diagnosis not present

## 2020-08-17 DIAGNOSIS — M25511 Pain in right shoulder: Secondary | ICD-10-CM | POA: Diagnosis not present

## 2020-08-19 DIAGNOSIS — M25561 Pain in right knee: Secondary | ICD-10-CM | POA: Diagnosis not present

## 2020-09-03 DIAGNOSIS — M25511 Pain in right shoulder: Secondary | ICD-10-CM | POA: Diagnosis not present

## 2020-09-06 DIAGNOSIS — M25561 Pain in right knee: Secondary | ICD-10-CM | POA: Diagnosis not present

## 2020-09-06 DIAGNOSIS — M25511 Pain in right shoulder: Secondary | ICD-10-CM | POA: Diagnosis not present

## 2020-09-10 DIAGNOSIS — M25511 Pain in right shoulder: Secondary | ICD-10-CM | POA: Diagnosis not present

## 2020-09-10 DIAGNOSIS — Z472 Encounter for removal of internal fixation device: Secondary | ICD-10-CM | POA: Diagnosis not present

## 2020-09-10 DIAGNOSIS — I890 Lymphedema, not elsewhere classified: Secondary | ICD-10-CM | POA: Diagnosis not present

## 2020-09-10 DIAGNOSIS — Z4889 Encounter for other specified surgical aftercare: Secondary | ICD-10-CM | POA: Diagnosis not present

## 2020-09-10 DIAGNOSIS — S46021A Laceration of muscle(s) and tendon(s) of the rotator cuff of right shoulder, initial encounter: Secondary | ICD-10-CM | POA: Diagnosis not present

## 2020-09-10 DIAGNOSIS — M7501 Adhesive capsulitis of right shoulder: Secondary | ICD-10-CM | POA: Diagnosis not present

## 2020-09-10 DIAGNOSIS — M75101 Unspecified rotator cuff tear or rupture of right shoulder, not specified as traumatic: Secondary | ICD-10-CM | POA: Diagnosis not present

## 2020-09-10 DIAGNOSIS — G8918 Other acute postprocedural pain: Secondary | ICD-10-CM | POA: Diagnosis not present

## 2020-09-10 DIAGNOSIS — M7541 Impingement syndrome of right shoulder: Secondary | ICD-10-CM | POA: Diagnosis not present

## 2020-09-10 DIAGNOSIS — S46011A Strain of muscle(s) and tendon(s) of the rotator cuff of right shoulder, initial encounter: Secondary | ICD-10-CM | POA: Diagnosis not present

## 2020-09-10 DIAGNOSIS — M24011 Loose body in right shoulder: Secondary | ICD-10-CM | POA: Diagnosis not present

## 2020-09-10 DIAGNOSIS — M65811 Other synovitis and tenosynovitis, right shoulder: Secondary | ICD-10-CM | POA: Diagnosis not present

## 2020-09-13 DIAGNOSIS — S83281A Other tear of lateral meniscus, current injury, right knee, initial encounter: Secondary | ICD-10-CM | POA: Diagnosis not present

## 2020-09-13 DIAGNOSIS — S83241A Other tear of medial meniscus, current injury, right knee, initial encounter: Secondary | ICD-10-CM | POA: Diagnosis not present

## 2020-09-22 DIAGNOSIS — M25511 Pain in right shoulder: Secondary | ICD-10-CM | POA: Diagnosis not present

## 2020-09-30 DIAGNOSIS — M25511 Pain in right shoulder: Secondary | ICD-10-CM | POA: Diagnosis not present

## 2020-10-06 DIAGNOSIS — M25511 Pain in right shoulder: Secondary | ICD-10-CM | POA: Diagnosis not present

## 2020-10-13 DIAGNOSIS — M25511 Pain in right shoulder: Secondary | ICD-10-CM | POA: Diagnosis not present

## 2020-10-25 DIAGNOSIS — M1711 Unilateral primary osteoarthritis, right knee: Secondary | ICD-10-CM | POA: Diagnosis not present

## 2020-10-25 DIAGNOSIS — M23301 Other meniscus derangements, unspecified lateral meniscus, left knee: Secondary | ICD-10-CM | POA: Diagnosis not present

## 2020-10-27 ENCOUNTER — Ambulatory Visit
Admission: RE | Admit: 2020-10-27 | Discharge: 2020-10-27 | Disposition: A | Discharge status: Home / Self Care | Payer: Medicare Other | Source: Ambulatory Visit | Attending: Family Medicine | Admitting: Family Medicine

## 2020-10-27 ENCOUNTER — Other Ambulatory Visit: Payer: Self-pay

## 2020-10-27 DIAGNOSIS — M85851 Other specified disorders of bone density and structure, right thigh: Secondary | ICD-10-CM | POA: Diagnosis not present

## 2020-10-27 DIAGNOSIS — Z780 Asymptomatic menopausal state: Secondary | ICD-10-CM | POA: Diagnosis not present

## 2020-10-27 DIAGNOSIS — Z1231 Encounter for screening mammogram for malignant neoplasm of breast: Secondary | ICD-10-CM | POA: Diagnosis not present

## 2020-10-27 DIAGNOSIS — E2839 Other primary ovarian failure: Secondary | ICD-10-CM

## 2020-11-01 ENCOUNTER — Other Ambulatory Visit: Payer: Self-pay | Admitting: Family Medicine

## 2020-11-01 DIAGNOSIS — R928 Other abnormal and inconclusive findings on diagnostic imaging of breast: Secondary | ICD-10-CM

## 2020-11-03 DIAGNOSIS — M25511 Pain in right shoulder: Secondary | ICD-10-CM | POA: Diagnosis not present

## 2020-11-10 DIAGNOSIS — M25511 Pain in right shoulder: Secondary | ICD-10-CM | POA: Diagnosis not present

## 2020-11-11 ENCOUNTER — Other Ambulatory Visit: Payer: Self-pay

## 2020-11-11 ENCOUNTER — Ambulatory Visit
Admission: RE | Admit: 2020-11-11 | Discharge: 2020-11-11 | Disposition: A | Discharge status: Home / Self Care | Payer: Medicare Other | Source: Ambulatory Visit | Attending: Family Medicine | Admitting: Family Medicine

## 2020-11-11 DIAGNOSIS — R922 Inconclusive mammogram: Secondary | ICD-10-CM | POA: Diagnosis not present

## 2020-11-11 DIAGNOSIS — R928 Other abnormal and inconclusive findings on diagnostic imaging of breast: Secondary | ICD-10-CM

## 2020-11-16 DIAGNOSIS — M94261 Chondromalacia, right knee: Secondary | ICD-10-CM | POA: Diagnosis not present

## 2020-11-16 DIAGNOSIS — S83271A Complex tear of lateral meniscus, current injury, right knee, initial encounter: Secondary | ICD-10-CM | POA: Diagnosis not present

## 2020-11-16 DIAGNOSIS — G8918 Other acute postprocedural pain: Secondary | ICD-10-CM | POA: Diagnosis not present

## 2020-12-06 ENCOUNTER — Emergency Department (HOSPITAL_COMMUNITY): Payer: Medicare Other

## 2020-12-06 ENCOUNTER — Encounter (HOSPITAL_COMMUNITY): Payer: Self-pay

## 2020-12-06 ENCOUNTER — Emergency Department (HOSPITAL_COMMUNITY)
Admission: EM | Admit: 2020-12-06 | Discharge: 2020-12-06 | Disposition: A | Discharge status: Home / Self Care | Payer: Medicare Other | Attending: Emergency Medicine | Admitting: Emergency Medicine

## 2020-12-06 DIAGNOSIS — I10 Essential (primary) hypertension: Secondary | ICD-10-CM | POA: Insufficient documentation

## 2020-12-06 DIAGNOSIS — Z5321 Procedure and treatment not carried out due to patient leaving prior to being seen by health care provider: Secondary | ICD-10-CM | POA: Diagnosis not present

## 2020-12-06 DIAGNOSIS — R0600 Dyspnea, unspecified: Secondary | ICD-10-CM | POA: Insufficient documentation

## 2020-12-06 DIAGNOSIS — R42 Dizziness and giddiness: Secondary | ICD-10-CM | POA: Insufficient documentation

## 2020-12-06 LAB — CBC
HCT: 40.7 % (ref 36.0–46.0)
Hemoglobin: 13.2 g/dL (ref 12.0–15.0)
MCH: 30.3 pg (ref 26.0–34.0)
MCHC: 32.4 g/dL (ref 30.0–36.0)
MCV: 93.3 fL (ref 80.0–100.0)
Platelets: 291 10*3/uL (ref 150–400)
RBC: 4.36 MIL/uL (ref 3.87–5.11)
RDW: 14 % (ref 11.5–15.5)
WBC: 8.5 10*3/uL (ref 4.0–10.5)
nRBC: 0 % (ref 0.0–0.2)

## 2020-12-06 LAB — BASIC METABOLIC PANEL
Anion gap: 11 (ref 5–15)
BUN: 12 mg/dL (ref 8–23)
CO2: 24 mmol/L (ref 22–32)
Calcium: 9.6 mg/dL (ref 8.9–10.3)
Chloride: 102 mmol/L (ref 98–111)
Creatinine, Ser: 0.52 mg/dL (ref 0.44–1.00)
GFR, Estimated: >60 mL/min (ref >60)
Glucose, Bld: 112 mg/dL — ABNORMAL HIGH (ref 70–99)
Potassium: 4.1 mmol/L (ref 3.5–5.1)
Sodium: 137 mmol/L (ref 135–145)

## 2020-12-06 LAB — BRAIN NATRIURETIC PEPTIDE: B Natriuretic Peptide: 9.9 pg/mL (ref 0.0–100.0)

## 2020-12-06 LAB — TROPONIN I (HIGH SENSITIVITY): Troponin I (High Sensitivity): 4 ng/L (ref <18)

## 2020-12-06 NOTE — ED Triage Notes (Addendum)
Pt arrived via POV, c/o high BP. States she has hx of same, has been taking meds for it with good effect until this week. Also c/o headaches this week, chest pain and dizziness.

## 2020-12-06 NOTE — ED Provider Notes (Signed)
Emergency Medicine Provider Triage Evaluation Note  FREYJA GOVEA , a 70 y.o. female  was evaluated in triage.  Pt complains of chest pain, dyspnea on exertion, headache, peripheral edema of 3-day duration.  Currently without chest pain.  Review of Systems  Positive:  Exertion, intermittent chest pain, headache Negative: Palpitations, abdominal pain, fever  Physical Exam  BP (!) 170/91 (BP Location: Right Arm)   Pulse 90   Temp 98.4 F (36.9 C) (Oral)   Resp 16   Ht 5\' 3"  (1.6 m)   Wt 70.8 kg   SpO2 96%   BMI 27.63 kg/m  Gen:   Awake, no distress   Resp:  Normal effort  MSK:   Moves extremities without difficulty  Other:    Medical Decision Making  Medically screening exam initiated at 1:44 PM.  Appropriate orders placed.  RAYCHEL DOWLER was informed that the remainder of the evaluation will be completed by another provider, this initial triage assessment does not replace that evaluation, and the importance of remaining in the ED until their evaluation is complete.     Evlyn Courier, PA-C 12/06/20 1345    Lajean Saver, MD 12/06/20 1640

## 2021-01-31 NOTE — Progress Notes (Addendum)
COVID swab appointment: n/a  COVID Vaccine Completed: yes x3 Date COVID Vaccine completed: Has received booster: COVID vaccine manufacturer: [employer]   Date of COVID positive in last 90 days: no  PCP - Melrose Nakayama, MD Cardiologist - n/a  Chest x-ray - 12/17/20 Care EKG - 12/07/20 Epic Stress Test - 2 years ago per pt ECHO - 2 years ago per pt Cardiac Cath - 30-40 years ago per pt for irregular heart beats Pacemaker/ICD device last checked: n/a Spinal Cord Stimulator: n/a  Sleep Study - n/a CPAP -   Fasting Blood Sugar - pre DM no check at home Checks Blood Sugar _____ times a day  Blood Thinner Instructions: n/a Aspirin Instructions: Last Dose:  Activity level: Can go up a flight of stairs and perform activities of daily living without stopping and without symptoms of chest pain. SOB with activity and cleaning       Anesthesia review: ED visit 12/06/20 for CP, DOE and peripheral edema, HTN, pre DM  Patient denies shortness of breath, fever, cough and chest pain at PAT appointment   Patient verbalized understanding of instructions that were given to them at the PAT appointment. Patient was also instructed that they will need to review over the PAT instructions again at home before surgery.

## 2021-01-31 NOTE — Patient Instructions (Addendum)
DUE TO COVID-19 ONLY ONE VISITOR IS ALLOWED TO COME WITH YOU AND STAY IN THE WAITING ROOM ONLY DURING PRE OP AND PROCEDURE.   **NO VISITORS ARE ALLOWED IN THE SHORT STAY AREA OR RECOVERY ROOM!!**   Your procedure is scheduled on: 02/03/21   Report to [facility] Main Entrance    Report to admitting at 9:45 AM   Call this number if you have problems the morning of surgery [phone]   Do not eat food :After Midnight.   May have liquids until 9:30 AM day of surgery  CLEAR LIQUID DIET  Foods Allowed                                                                     Foods Excluded  Water, Black Coffee and tea (no milk or creamer)           liquids that you cannot  Plain Jell-O in any flavor  (No red)                                   see through such as: Fruit ices (not with fruit pulp)                                           milk, soups, orange juice              Iced Popsicles (No red)                                               All solid food                                   Apple juices Sports drinks like Gatorade (No red) Lightly seasoned clear broth or consume(fat free) Sugar    The day of surgery:  Drink ONE (1) Pre-Surgery G2 by 9:30 am the morning of surgery. Drink in one sitting. Do not sip.  This drink was given to you during your hospital  pre-op appointment visit. Nothing else to drink after completing the  Pre-Surgery G2.          If you have questions, please contact your surgeons office.     Oral Hygiene is also important to reduce your risk of infection.                                    Remember - BRUSH YOUR TEETH THE MORNING OF SURGERY WITH YOUR REGULAR TOOTHPASTE   Take these medicines the morning of surgery with A SIP OF WATER: Zyrtec, Nexium, Inhaler                              You may not have any metal on your body  including hair pins, jewelry, and body piercing             Do not wear make-up, lotions, powders, perfumes, or  deodorant  Do not wear nail polish including gel and S&S, artificial/acrylic nails, or any other type of covering on natural nails including finger and toenails. If you have artificial nails, gel coating, etc. that needs to be removed by a nail salon please have this removed prior to surgery or surgery may need to be canceled/ delayed if the surgeon/ anesthesia feels like they are unable to be safely monitored.   Do not shave  48 hours prior to surgery.    Do not bring valuables to the hospital. [place].    Patients discharged on the day of surgery will not be allowed to drive home.  We recommend you have a responsible adult to stay with you for the first 24 hours after anesthesia.   Special Instructions: Bring a copy of your healthcare power of attorney and living will documents         the day of surgery if you haven't scanned them before.              Please read over the following fact sheets you were given: IF YOU HAVE QUESTIONS ABOUT YOUR PRE-OP INSTRUCTIONS PLEASE CALL [facility] - Preparing for Surgery Before surgery, you can play an important role.  Because skin is not sterile, your skin needs to be as free of germs as possible.  You can reduce the number of germs on your skin by washing with CHG (chlorahexidine gluconate) soap before surgery.  CHG is an antiseptic cleaner which kills germs and bonds with the skin to continue killing germs even after washing. Please DO NOT use if you have an allergy to CHG or antibacterial soaps.  If your skin becomes reddened/irritated stop using the CHG and inform your nurse when you arrive at Short Stay. Do not shave (including legs and underarms) for at least 48 hours prior to the first CHG shower.  You may shave your face/neck.  Please follow these instructions carefully:  1.  Shower with CHG Soap the night before surgery and the  morning of surgery.  2.  If you choose to  wash your hair, wash your hair first as usual with your normal  shampoo.  3.  After you shampoo, rinse your hair and body thoroughly to remove the shampoo.                             4.  Use CHG as you would any other liquid soap.  You can apply chg directly to the skin and wash.  Gently with a scrungie or clean washcloth.  5.  Apply the CHG Soap to your body ONLY FROM THE NECK DOWN.   Do   not use on face/ open                           Wound or open sores. Avoid contact with eyes, ears mouth and   genitals (private parts).                       Wash face,  Genitals (private parts) with your normal soap.  6.  Wash thoroughly, paying special attention to the area where your    surgery  will be performed.  7.  Thoroughly rinse your body with warm water from the neck down.  8.  DO NOT shower/wash with your normal soap after using and rinsing off the CHG Soap.                9.  Pat yourself dry with a clean towel.            10.  Wear clean pajamas.            11.  Place clean sheets on your bed the night of your first shower and do not  sleep with pets. Day of Surgery : Do not apply any lotions/deodorants the morning of surgery.  Please wear clean clothes to the hospital/surgery center.  FAILURE TO FOLLOW THESE INSTRUCTIONS MAY RESULT IN THE CANCELLATION OF YOUR SURGERY  PATIENT SIGNATURE_________________________________  NURSE SIGNATURE__________________________________  ________________________________________________________________________   Kathleen Reyes  An incentive spirometer is a tool that can help keep your lungs clear and active. This tool measures how well you are filling your lungs with each breath. Taking long deep breaths may help reverse or decrease the chance of developing breathing (pulmonary) problems (especially infection) following: A long period of time when you are unable to move or be active. BEFORE THE PROCEDURE  If the spirometer includes an  indicator to show your best effort, your nurse or respiratory therapist will set it to a desired goal. If possible, sit up straight or lean slightly forward. Try not to slouch. Hold the incentive spirometer in an upright position. INSTRUCTIONS FOR USE  Sit on the edge of your bed if possible, or sit up as far as you can in bed or on a chair. Hold the incentive spirometer in an upright position. Breathe out normally. Place the mouthpiece in your mouth and seal your lips tightly around it. Breathe in slowly and as deeply as possible, raising the piston or the ball toward the top of the column. Hold your breath for 3-5 seconds or for as long as possible. Allow the piston or ball to fall to the bottom of the column. Remove the mouthpiece from your mouth and breathe out normally. Rest for a few seconds and repeat Steps 1 through 7 at least 10 times every 1-2 hours when you are awake. Take your time and take a few normal breaths between deep breaths. The spirometer may include an indicator to show your best effort. Use the indicator as a goal to work toward during each repetition. After each set of 10 deep breaths, practice coughing to be sure your lungs are clear. If you have an incision (the cut made at the time of surgery), support your incision when coughing by placing a pillow or rolled up towels firmly against it. Once you are able to get out of bed, walk around indoors and cough well. You may stop using the incentive spirometer when instructed by your caregiver.  RISKS AND COMPLICATIONS Take your time so you do not get dizzy or light-headed. If you are in pain, you may need to take or ask for pain medication before doing incentive spirometry. It is harder to take a deep breath if you are having pain. AFTER USE Rest and breathe slowly and easily. It can be helpful to keep track of a log of your progress. Your caregiver can provide you with a simple table to help with this. If  you are using the  spirometer at home, follow these instructions: [place] IF:  You are having difficultly using the spirometer. You have trouble using the spirometer as often as instructed. Your pain medication is not giving enough relief while using the spirometer. You develop fever of 100.5 F (38.1 C) or higher. SEEK IMMEDIATE MEDICAL CARE IF:  You cough up bloody sputum that had not been present before. You develop fever of 102 F (38.9 C) or greater. You develop worsening pain at or near the incision site. MAKE SURE YOU:  Understand these instructions. Will watch your condition. Will get help right away if you are not doing well or get worse. Document Released: 05/22/2006 Document Revised: 04/03/2011 Document Reviewed: 07/23/2006 ExitCare Patient Information 2014 [employer].   ________________________________________________________________________  [facility]- Preparing for Total Shoulder Arthroplasty    Before surgery, you can play an important role. Because skin is not sterile, your skin needs to be as free of germs as possible. You can reduce the number of germs on your skin by using the following products. Benzoyl Peroxide Gel Reduces the number of germs present on the skin Applied twice a day to shoulder area starting two days before surgery    ==================================================================  Please follow these instructions carefully:  BENZOYL PEROXIDE 5% GEL  Please do not use if you have an allergy to benzoyl peroxide.   If your skin becomes reddened/irritated stop using the benzoyl peroxide.  Starting two days before surgery, apply as follows: Apply benzoyl peroxide in the morning and at night. Apply after taking a shower. If you are not taking a shower clean entire shoulder front, back, and side along with the armpit with a clean wet washcloth.  Place a quarter-sized dollop on your shoulder and rub in thoroughly, making sure to cover the front,  back, and side of your shoulder, along with the armpit.   2 days before ____ AM   ____ PM              1 day before ____ AM   ____ PM                         Do this twice a day for two days.  (Last application is the night before surgery, AFTER using the CHG soap as described below).  Do NOT apply benzoyl peroxide gel on the day of surgery.

## 2021-02-01 ENCOUNTER — Encounter (HOSPITAL_COMMUNITY): Payer: Self-pay

## 2021-02-01 ENCOUNTER — Encounter (HOSPITAL_COMMUNITY)
Admission: RE | Admit: 2021-02-01 | Discharge: 2021-02-01 | Disposition: A | Discharge status: Home / Self Care | Payer: Medicare Other | Source: Ambulatory Visit | Attending: Orthopedic Surgery | Admitting: Orthopedic Surgery

## 2021-02-01 ENCOUNTER — Other Ambulatory Visit: Payer: Self-pay

## 2021-02-01 DIAGNOSIS — Z01818 Encounter for other preprocedural examination: Secondary | ICD-10-CM

## 2021-02-01 DIAGNOSIS — J45909 Unspecified asthma, uncomplicated: Secondary | ICD-10-CM | POA: Insufficient documentation

## 2021-02-01 DIAGNOSIS — K219 Gastro-esophageal reflux disease without esophagitis: Secondary | ICD-10-CM | POA: Insufficient documentation

## 2021-02-01 DIAGNOSIS — M12811 Other specific arthropathies, not elsewhere classified, right shoulder: Secondary | ICD-10-CM | POA: Diagnosis not present

## 2021-02-01 DIAGNOSIS — Z79899 Other long term (current) drug therapy: Secondary | ICD-10-CM | POA: Insufficient documentation

## 2021-02-01 DIAGNOSIS — D500 Iron deficiency anemia secondary to blood loss (chronic): Secondary | ICD-10-CM | POA: Insufficient documentation

## 2021-02-01 DIAGNOSIS — I10 Essential (primary) hypertension: Secondary | ICD-10-CM | POA: Insufficient documentation

## 2021-02-01 DIAGNOSIS — Z01812 Encounter for preprocedural laboratory examination: Secondary | ICD-10-CM | POA: Diagnosis present

## 2021-02-01 DIAGNOSIS — M75101 Unspecified rotator cuff tear or rupture of right shoulder, not specified as traumatic: Secondary | ICD-10-CM | POA: Insufficient documentation

## 2021-02-01 HISTORY — DX: Essential (primary) hypertension: I10

## 2021-02-01 LAB — CBC
HCT: 38.1 % (ref 36.0–46.0)
Hemoglobin: 12.8 g/dL (ref 12.0–15.0)
MCH: 31.1 pg (ref 26.0–34.0)
MCHC: 33.6 g/dL (ref 30.0–36.0)
MCV: 92.7 fL (ref 80.0–100.0)
Platelets: 291 10*3/uL (ref 150–400)
RBC: 4.11 MIL/uL (ref 3.87–5.11)
RDW: 13.4 % (ref 11.5–15.5)
WBC: 9.9 10*3/uL (ref 4.0–10.5)
nRBC: 0 % (ref 0.0–0.2)

## 2021-02-01 LAB — SURGICAL PCR SCREEN
MRSA, PCR: NEGATIVE
Staphylococcus aureus: NEGATIVE

## 2021-02-01 LAB — GLUCOSE, CAPILLARY: Glucose-Capillary: 112 mg/dL — ABNORMAL HIGH (ref 70–99)

## 2021-02-02 NOTE — Progress Notes (Signed)
Anesthesia Chart Review   Case: [id number] Date/Time: 02/03/21 1215   Procedure: REVERSE SHOULDER ARTHROPLASTY (Right: Shoulder) - 174min   Anesthesia type: General   Pre-op diagnosis: Right shoulder rotator cuff tear arthropathy   Location: [facility] ROOM 06 / [facility]   Surgeons: Justice Britain, MD       DISCUSSION:71 y.o. never smoker with h/o HTN, GERD, asthma, right shoulder rotator cuff tear scheduled for above procedure 02/03/2021 with Dr. Justice Britain.   Pt presented to the ED 12/06/2020 due to elevated BP, headaches, intermittent chest pain, dizziness.  Negative troponin, no changes on EKG.   Followed up with PCP 01/03/2021.  Pt started on hydrochlorothiazide 25 mg daily in addition to Cozaar 50 mg daily. Follow up 01/28/21, hydrochlorothiazide increased to 50mg .  Pt denies cv sx.  PCP aware of upcoming surgery. Evaluate DOS.  VS: BP (!) 143/89    Pulse (!) 102    Temp 36.9 C (Oral)    Resp 14    Ht 5\' 3"  (1.6 m)    Wt 70.2 kg    SpO2 100%    BMI 27.42 kg/m   PROVIDERS: [place], Tyler Pita, MD is PCP    LABS: Labs reviewed: Acceptable for surgery. (all labs ordered are listed, but only abnormal results are displayed)  Labs Reviewed  GLUCOSE, CAPILLARY - Abnormal; Notable for the following components:      Result Value   Glucose-Capillary 112 (*)    All other components within normal limits  SURGICAL PCR SCREEN  CBC     IMAGES:   EKG:   CV:  Past Medical History:  Diagnosis Date   Asthma    Bleeding stomach ulcer x4   1989 & 2009, 2011   GERD (gastroesophageal reflux disease)    H/O renal calculi    Heart murmur    Hypertension    Migraines    Ulcer     Past Surgical History:  Procedure Laterality Date   ABDOMINAL HYSTERECTOMY  01/24/1980   BSO, secondary to endometriosis   ELBOW SURGERY Left about 2004   KNEE SURGERY Right 2011 & one other scope   SHOULDER ARTHROSCOPY Left    x2   TONSILLECTOMY     UPPER GASTROINTESTINAL ENDOSCOPY     WISDOM TOOTH EXTRACTION       MEDICATIONS:  ALPRAZolam (XANAX) 0.25 MG tablet   Bioflavonoid Products (BIOFLEX) TABS   Calcium Carb-Cholecalciferol (SM CALCIUM 500/VITAMIN D3 PO)   cetirizine (ZYRTEC) 10 MG tablet   esomeprazole (NEXIUM) 20 MG capsule   fluticasone (FLONASE) 50 MCG/ACT nasal spray   Fluticasone-Salmeterol (ADVAIR) 100-50 MCG/DOSE AEPB   gabapentin (NEURONTIN) 600 MG tablet   hydrochlorothiazide (HYDRODIURIL) 50 MG tablet   losartan (COZAAR) 50 MG tablet   PROAIR HFA 108 (90 BASE) MCG/ACT inhaler   rOPINIRole (REQUIP) 3 MG tablet   No current facility-administered medications for this encounter.    Konrad Felix Ward, PA-C [facility] Pre-Surgical Testing [phone]

## 2021-02-03 ENCOUNTER — Encounter (HOSPITAL_COMMUNITY)
Admission: RE | Disposition: A | Discharge status: Home / Self Care | Payer: Self-pay | Source: Home / Self Care | Attending: Orthopedic Surgery

## 2021-02-03 ENCOUNTER — Ambulatory Visit (HOSPITAL_COMMUNITY): Payer: Medicare Other | Admitting: Certified Registered Nurse Anesthetist

## 2021-02-03 ENCOUNTER — Encounter (HOSPITAL_COMMUNITY): Payer: Self-pay | Admitting: Orthopedic Surgery

## 2021-02-03 ENCOUNTER — Ambulatory Visit (HOSPITAL_COMMUNITY)
Admission: RE | Admit: 2021-02-03 | Discharge: 2021-02-04 | Disposition: A | Discharge status: Home / Self Care | Payer: Medicare Other | Attending: Orthopedic Surgery | Admitting: Orthopedic Surgery

## 2021-02-03 ENCOUNTER — Other Ambulatory Visit: Payer: Self-pay

## 2021-02-03 DIAGNOSIS — Z79899 Other long term (current) drug therapy: Secondary | ICD-10-CM | POA: Diagnosis not present

## 2021-02-03 DIAGNOSIS — Z7951 Long term (current) use of inhaled steroids: Secondary | ICD-10-CM | POA: Diagnosis not present

## 2021-02-03 DIAGNOSIS — Z96611 Presence of right artificial shoulder joint: Secondary | ICD-10-CM | POA: Diagnosis present

## 2021-02-03 DIAGNOSIS — I10 Essential (primary) hypertension: Secondary | ICD-10-CM | POA: Diagnosis not present

## 2021-02-03 DIAGNOSIS — Z01818 Encounter for other preprocedural examination: Secondary | ICD-10-CM

## 2021-02-03 DIAGNOSIS — J45909 Unspecified asthma, uncomplicated: Secondary | ICD-10-CM | POA: Diagnosis not present

## 2021-02-03 DIAGNOSIS — K219 Gastro-esophageal reflux disease without esophagitis: Secondary | ICD-10-CM | POA: Diagnosis not present

## 2021-02-03 DIAGNOSIS — M75101 Unspecified rotator cuff tear or rupture of right shoulder, not specified as traumatic: Secondary | ICD-10-CM | POA: Diagnosis not present

## 2021-02-03 DIAGNOSIS — D500 Iron deficiency anemia secondary to blood loss (chronic): Secondary | ICD-10-CM

## 2021-02-03 HISTORY — DX: Family history of other specified conditions: Z84.89

## 2021-02-03 HISTORY — PX: REVERSE SHOULDER ARTHROPLASTY: SHX5054

## 2021-02-03 SURGERY — ARTHROPLASTY, SHOULDER, TOTAL, REVERSE
Anesthesia: General | Site: Shoulder | Laterality: Right

## 2021-02-03 MED ORDER — LACTATED RINGERS IV SOLN: INTRAVENOUS | Status: DC

## 2021-02-03 MED ORDER — HYDROMORPHONE HCL 1 MG/ML IJ SOLN
INTRAMUSCULAR | Status: AC
  Filled 2021-02-03: qty 1

## 2021-02-03 MED ORDER — OXYCODONE HCL 5 MG PO TABS
5.0000 mg | ORAL_TABLET | ORAL | Status: DC | PRN
  Administered 2021-02-03 – 2021-02-04 (×2): 5 mg via ORAL
  Filled 2021-02-03: qty 1
  Filled 2021-02-03: qty 2
  Filled 2021-02-03: qty 1
  Filled 2021-02-03: qty 2

## 2021-02-03 MED ORDER — AMISULPRIDE (ANTIEMETIC) 5 MG/2ML IV SOLN: 10.0000 mg | Freq: Once | INTRAVENOUS | Status: DC | PRN

## 2021-02-03 MED ORDER — LOSARTAN POTASSIUM 50 MG PO TABS
50.0000 mg | ORAL_TABLET | Freq: Every day | ORAL | Status: DC
  Administered 2021-02-03: 50 mg via ORAL
  Filled 2021-02-03: qty 1

## 2021-02-03 MED ORDER — DIPHENHYDRAMINE HCL 12.5 MG/5ML PO ELIX: 12.5000 mg | ORAL_SOLUTION | ORAL | Status: DC | PRN

## 2021-02-03 MED ORDER — ONDANSETRON HCL 4 MG/2ML IJ SOLN: 4.0000 mg | Freq: Four times a day (QID) | INTRAMUSCULAR | Status: DC | PRN

## 2021-02-03 MED ORDER — LACTATED RINGERS IV BOLUS: 250.0000 mL | Freq: Once | INTRAVENOUS | Status: DC

## 2021-02-03 MED ORDER — OXYCODONE HCL 5 MG PO TABS
10.0000 mg | ORAL_TABLET | ORAL | Status: DC | PRN
  Administered 2021-02-04: 10 mg via ORAL

## 2021-02-03 MED ORDER — VANCOMYCIN HCL 1000 MG IV SOLR
INTRAVENOUS | Status: DC | PRN
  Administered 2021-02-03: 1000 mg

## 2021-02-03 MED ORDER — DEXAMETHASONE SODIUM PHOSPHATE 10 MG/ML IJ SOLN
INTRAMUSCULAR | Status: DC | PRN
  Administered 2021-02-03: 10 mg via INTRAVENOUS

## 2021-02-03 MED ORDER — METHOCARBAMOL 500 MG PO TABS
500.0000 mg | ORAL_TABLET | Freq: Four times a day (QID) | ORAL | Status: DC | PRN
  Administered 2021-02-04: 500 mg via ORAL
  Filled 2021-02-03: qty 1

## 2021-02-03 MED ORDER — BUPIVACAINE LIPOSOME 1.3 % IJ SUSP
INTRAMUSCULAR | Status: DC | PRN
  Administered 2021-02-03: 10 mL via PERINEURAL

## 2021-02-03 MED ORDER — 0.9 % SODIUM CHLORIDE (POUR BTL) OPTIME
TOPICAL | Status: DC | PRN
  Administered 2021-02-03: 1000 mL

## 2021-02-03 MED ORDER — FENTANYL CITRATE (PF) 100 MCG/2ML IJ SOLN
INTRAMUSCULAR | Status: DC | PRN
Start: 2021-02-03 — End: 2021-02-03
  Administered 2021-02-03 (×2): 50 ug via INTRAVENOUS

## 2021-02-03 MED ORDER — METOCLOPRAMIDE HCL 5 MG/ML IJ SOLN: 5.0000 mg | Freq: Three times a day (TID) | INTRAMUSCULAR | Status: DC | PRN

## 2021-02-03 MED ORDER — LACTATED RINGERS IV BOLUS: 500.0000 mL | Freq: Once | INTRAVENOUS | Status: DC

## 2021-02-03 MED ORDER — ROCURONIUM BROMIDE 10 MG/ML (PF) SYRINGE
PREFILLED_SYRINGE | INTRAVENOUS | Status: DC | PRN
  Administered 2021-02-03: 60 mg via INTRAVENOUS

## 2021-02-03 MED ORDER — PANTOPRAZOLE SODIUM 40 MG PO TBEC
40.0000 mg | DELAYED_RELEASE_TABLET | Freq: Every day | ORAL | Status: DC
  Administered 2021-02-04: 40 mg via ORAL
  Filled 2021-02-03: qty 1

## 2021-02-03 MED ORDER — OXYCODONE-ACETAMINOPHEN 5-325 MG PO TABS: 1.0000 | ORAL_TABLET | ORAL | 0 refills | Status: AC | PRN

## 2021-02-03 MED ORDER — ROPINIROLE HCL 1 MG PO TABS
3.0000 mg | ORAL_TABLET | Freq: Every day | ORAL | Status: DC
  Administered 2021-02-03: 3 mg via ORAL
  Filled 2021-02-03: qty 3

## 2021-02-03 MED ORDER — ONDANSETRON HCL 4 MG/2ML IJ SOLN
INTRAMUSCULAR | Status: AC
  Filled 2021-02-03: qty 2

## 2021-02-03 MED ORDER — MIDAZOLAM HCL 5 MG/5ML IJ SOLN
INTRAMUSCULAR | Status: DC | PRN
  Administered 2021-02-03: 2 mg via INTRAVENOUS

## 2021-02-03 MED ORDER — PHENYLEPHRINE 40 MCG/ML (10ML) SYRINGE FOR IV PUSH (FOR BLOOD PRESSURE SUPPORT)
PREFILLED_SYRINGE | INTRAVENOUS | Status: DC | PRN
  Administered 2021-02-03: 80 ug via INTRAVENOUS

## 2021-02-03 MED ORDER — STERILE WATER FOR IRRIGATION IR SOLN
Status: DC | PRN
  Administered 2021-02-03: 2000 mL

## 2021-02-03 MED ORDER — POLYETHYLENE GLYCOL 3350 17 G PO PACK: 17.0000 g | PACK | Freq: Every day | ORAL | Status: DC | PRN

## 2021-02-03 MED ORDER — MIDAZOLAM HCL 2 MG/2ML IJ SOLN
1.0000 mg | INTRAMUSCULAR | Status: AC
  Administered 2021-02-03: 2 mg via INTRAVENOUS
  Filled 2021-02-03: qty 2

## 2021-02-03 MED ORDER — ONDANSETRON HCL 4 MG PO TABS: 4.0000 mg | ORAL_TABLET | Freq: Four times a day (QID) | ORAL | Status: DC | PRN

## 2021-02-03 MED ORDER — PHENOL 1.4 % MT LIQD: 1.0000 | OROMUCOSAL | Status: DC | PRN

## 2021-02-03 MED ORDER — MENTHOL 3 MG MT LOZG: 1.0000 | LOZENGE | OROMUCOSAL | Status: DC | PRN

## 2021-02-03 MED ORDER — FENTANYL CITRATE (PF) 100 MCG/2ML IJ SOLN
INTRAMUSCULAR | Status: AC
  Filled 2021-02-03: qty 2

## 2021-02-03 MED ORDER — ACETAMINOPHEN 325 MG PO TABS: 325.0000 mg | ORAL_TABLET | Freq: Once | ORAL | Status: DC | PRN

## 2021-02-03 MED ORDER — ALPRAZOLAM 0.25 MG PO TABS
0.2500 mg | ORAL_TABLET | Freq: Every day | ORAL | Status: DC
  Administered 2021-02-03: 0.25 mg via ORAL
  Filled 2021-02-03: qty 1

## 2021-02-03 MED ORDER — ACETAMINOPHEN 325 MG PO TABS: 325.0000 mg | ORAL_TABLET | Freq: Four times a day (QID) | ORAL | Status: DC | PRN

## 2021-02-03 MED ORDER — ALBUTEROL SULFATE (2.5 MG/3ML) 0.083% IN NEBU: 2.5000 mg | INHALATION_SOLUTION | Freq: Four times a day (QID) | RESPIRATORY_TRACT | Status: DC | PRN

## 2021-02-03 MED ORDER — CEFAZOLIN SODIUM-DEXTROSE 2-4 GM/100ML-% IV SOLN
2.0000 g | INTRAVENOUS | Status: AC
  Administered 2021-02-03: 2 g via INTRAVENOUS
  Filled 2021-02-03: qty 100

## 2021-02-03 MED ORDER — BISACODYL 5 MG PO TBEC: 5.0000 mg | DELAYED_RELEASE_TABLET | Freq: Every day | ORAL | Status: DC | PRN

## 2021-02-03 MED ORDER — PHENYLEPHRINE 40 MCG/ML (10ML) SYRINGE FOR IV PUSH (FOR BLOOD PRESSURE SUPPORT)
PREFILLED_SYRINGE | INTRAVENOUS | Status: AC
  Filled 2021-02-03: qty 10

## 2021-02-03 MED ORDER — PANTOPRAZOLE SODIUM 40 MG PO TBEC: 40.0000 mg | DELAYED_RELEASE_TABLET | Freq: Every day | ORAL | Status: DC

## 2021-02-03 MED ORDER — LACTATED RINGERS IV BOLUS
500.0000 mL | Freq: Once | INTRAVENOUS | Status: AC
  Administered 2021-02-03: 500 mL via INTRAVENOUS

## 2021-02-03 MED ORDER — LIDOCAINE HCL (PF) 2 % IJ SOLN
INTRAMUSCULAR | Status: AC
  Filled 2021-02-03: qty 5

## 2021-02-03 MED ORDER — ORAL CARE MOUTH RINSE: 15.0000 mL | Freq: Once | OROMUCOSAL | Status: AC

## 2021-02-03 MED ORDER — SUGAMMADEX SODIUM 200 MG/2ML IV SOLN
INTRAVENOUS | Status: DC | PRN
  Administered 2021-02-03: 200 mg via INTRAVENOUS

## 2021-02-03 MED ORDER — ALUM & MAG HYDROXIDE-SIMETH 200-200-20 MG/5ML PO SUSP: 30.0000 mL | ORAL | Status: DC | PRN

## 2021-02-03 MED ORDER — DOCUSATE SODIUM 100 MG PO CAPS
100.0000 mg | ORAL_CAPSULE | Freq: Two times a day (BID) | ORAL | Status: DC
  Administered 2021-02-03 – 2021-02-04 (×2): 100 mg via ORAL
  Filled 2021-02-03 (×2): qty 1

## 2021-02-03 MED ORDER — MIDAZOLAM HCL 2 MG/2ML IJ SOLN
INTRAMUSCULAR | Status: AC
  Filled 2021-02-03: qty 2

## 2021-02-03 MED ORDER — ONDANSETRON HCL 4 MG PO TABS
4.0000 mg | ORAL_TABLET | Freq: Four times a day (QID) | ORAL | Status: DC | PRN
  Administered 2021-02-04: 4 mg via ORAL
  Filled 2021-02-03: qty 1

## 2021-02-03 MED ORDER — ONDANSETRON HCL 4 MG/2ML IJ SOLN
INTRAMUSCULAR | Status: DC | PRN
Start: 2021-02-03 — End: 2021-02-03
  Administered 2021-02-03: 4 mg via INTRAVENOUS

## 2021-02-03 MED ORDER — ONDANSETRON HCL 4 MG PO TABS: 4.0000 mg | ORAL_TABLET | Freq: Three times a day (TID) | ORAL | 0 refills | Status: AC | PRN

## 2021-02-03 MED ORDER — METOCLOPRAMIDE HCL 5 MG PO TABS: 5.0000 mg | ORAL_TABLET | Freq: Three times a day (TID) | ORAL | Status: DC | PRN

## 2021-02-03 MED ORDER — HYDROCHLOROTHIAZIDE 25 MG PO TABS
50.0000 mg | ORAL_TABLET | Freq: Every day | ORAL | Status: DC
  Administered 2021-02-03 – 2021-02-04 (×2): 50 mg via ORAL
  Filled 2021-02-03 (×2): qty 2

## 2021-02-03 MED ORDER — DOCUSATE SODIUM 100 MG PO CAPS: 100.0000 mg | ORAL_CAPSULE | Freq: Two times a day (BID) | ORAL | Status: DC

## 2021-02-03 MED ORDER — PROPOFOL 10 MG/ML IV BOLUS
INTRAVENOUS | Status: DC | PRN
  Administered 2021-02-03: 160 mg via INTRAVENOUS

## 2021-02-03 MED ORDER — ACETAMINOPHEN 160 MG/5ML PO SOLN: 325.0000 mg | Freq: Once | ORAL | Status: DC | PRN

## 2021-02-03 MED ORDER — FLUTICASONE PROPIONATE 50 MCG/ACT NA SUSP
1.0000 | Freq: Every day | NASAL | Status: DC
  Filled 2021-02-03: qty 16

## 2021-02-03 MED ORDER — METHOCARBAMOL 1000 MG/10ML IJ SOLN
500.0000 mg | Freq: Four times a day (QID) | INTRAVENOUS | Status: DC | PRN
  Filled 2021-02-03: qty 5

## 2021-02-03 MED ORDER — TRANEXAMIC ACID-NACL 1000-0.7 MG/100ML-% IV SOLN
1000.0000 mg | INTRAVENOUS | Status: AC
  Administered 2021-02-03: 1000 mg via INTRAVENOUS
  Filled 2021-02-03: qty 100

## 2021-02-03 MED ORDER — CYCLOBENZAPRINE HCL 10 MG PO TABS: 10.0000 mg | ORAL_TABLET | Freq: Three times a day (TID) | ORAL | 1 refills | Status: AC | PRN

## 2021-02-03 MED ORDER — DIPHENHYDRAMINE HCL 50 MG/ML IJ SOLN
INTRAMUSCULAR | Status: AC
  Filled 2021-02-03: qty 1

## 2021-02-03 MED ORDER — ACETAMINOPHEN 10 MG/ML IV SOLN
1000.0000 mg | Freq: Once | INTRAVENOUS | Status: DC | PRN
  Administered 2021-02-03: 1000 mg via INTRAVENOUS

## 2021-02-03 MED ORDER — HYDROMORPHONE HCL 1 MG/ML IJ SOLN
0.2500 mg | INTRAMUSCULAR | Status: DC | PRN
  Administered 2021-02-03 (×4): 0.5 mg via INTRAVENOUS

## 2021-02-03 MED ORDER — ALBUTEROL SULFATE HFA 108 (90 BASE) MCG/ACT IN AERS: 2.0000 | INHALATION_SPRAY | Freq: Four times a day (QID) | RESPIRATORY_TRACT | Status: DC | PRN

## 2021-02-03 MED ORDER — HYDROMORPHONE HCL 1 MG/ML IJ SOLN
0.5000 mg | INTRAMUSCULAR | Status: DC | PRN
  Administered 2021-02-04: 0.5 mg via INTRAVENOUS
  Filled 2021-02-03: qty 1

## 2021-02-03 MED ORDER — VANCOMYCIN HCL 1000 MG IV SOLR
INTRAVENOUS | Status: AC
  Filled 2021-02-03: qty 20

## 2021-02-03 MED ORDER — GABAPENTIN 300 MG PO CAPS
600.0000 mg | ORAL_CAPSULE | Freq: Every day | ORAL | Status: DC
  Administered 2021-02-03: 600 mg via ORAL
  Filled 2021-02-03: qty 2

## 2021-02-03 MED ORDER — PROPOFOL 10 MG/ML IV BOLUS
INTRAVENOUS | Status: AC
  Filled 2021-02-03: qty 20

## 2021-02-03 MED ORDER — PHENYLEPHRINE HCL-NACL 20-0.9 MG/250ML-% IV SOLN
INTRAVENOUS | Status: DC | PRN
  Administered 2021-02-03: 25 ug/min via INTRAVENOUS

## 2021-02-03 MED ORDER — FENTANYL CITRATE PF 50 MCG/ML IJ SOSY
50.0000 ug | PREFILLED_SYRINGE | INTRAMUSCULAR | Status: AC
  Administered 2021-02-03: 100 ug via INTRAVENOUS
  Filled 2021-02-03: qty 2

## 2021-02-03 MED ORDER — BUPIVACAINE HCL (PF) 0.5 % IJ SOLN
INTRAMUSCULAR | Status: DC | PRN
  Administered 2021-02-03: 10 mL via PERINEURAL

## 2021-02-03 MED ORDER — ACETAMINOPHEN 10 MG/ML IV SOLN
INTRAVENOUS | Status: AC
  Filled 2021-02-03: qty 100

## 2021-02-03 MED ORDER — CHLORHEXIDINE GLUCONATE 0.12 % MT SOLN
15.0000 mL | Freq: Once | OROMUCOSAL | Status: AC
  Administered 2021-02-03: 15 mL via OROMUCOSAL

## 2021-02-03 MED ORDER — DIPHENHYDRAMINE HCL 50 MG/ML IJ SOLN
6.2500 mg | Freq: Once | INTRAMUSCULAR | Status: AC
  Administered 2021-02-03: 6.5 mg via INTRAVENOUS

## 2021-02-03 MED ORDER — DEXAMETHASONE SODIUM PHOSPHATE 10 MG/ML IJ SOLN
INTRAMUSCULAR | Status: AC
  Filled 2021-02-03: qty 1

## 2021-02-03 MED ORDER — PROMETHAZINE HCL 25 MG/ML IJ SOLN: 6.2500 mg | INTRAMUSCULAR | Status: DC | PRN

## 2021-02-03 MED ORDER — ROCURONIUM BROMIDE 10 MG/ML (PF) SYRINGE
PREFILLED_SYRINGE | INTRAVENOUS | Status: AC
  Filled 2021-02-03: qty 10

## 2021-02-03 MED ORDER — MEPERIDINE HCL 50 MG/ML IJ SOLN: 6.2500 mg | INTRAMUSCULAR | Status: DC | PRN

## 2021-02-03 MED ORDER — MOMETASONE FURO-FORMOTEROL FUM 100-5 MCG/ACT IN AERO
2.0000 | INHALATION_SPRAY | Freq: Two times a day (BID) | RESPIRATORY_TRACT | Status: DC
  Filled 2021-02-03: qty 8.8

## 2021-02-03 SURGICAL SUPPLY — 77 items
ADH SKN CLS APL DERMABOND .7 (GAUZE/BANDAGES/DRESSINGS) ×1
AID PSTN UNV HD RSTRNT DISP (MISCELLANEOUS) ×1
BAG COUNTER SPONGE SURGICOUNT (BAG) ×1 IMPLANT
BAG SPEC THK2 15X12 ZIP CLS (MISCELLANEOUS) ×1
BAG SPNG CNTER NS LX DISP (BAG) ×1
BAG ZIPLOCK 12X15 (MISCELLANEOUS) ×3 IMPLANT
BLADE SAW SGTL 83.5X18.5 (BLADE) ×3 IMPLANT
BNDG COHESIVE 4X5 TAN ST LF (GAUZE/BANDAGES/DRESSINGS) ×3 IMPLANT
BSPLAT GLND +2X24 MDLR (Joint) ×1 IMPLANT
COOLER ICEMAN CLASSIC (MISCELLANEOUS) ×3 IMPLANT
COVER BACK TABLE 60X90IN (DRAPES) ×3 IMPLANT
COVER SURGICAL LIGHT HANDLE (MISCELLANEOUS) ×3 IMPLANT
CUP SUT UNIV REVERS 36 NEUTRAL (Cup) ×1 IMPLANT
DERMABOND ADVANCED (GAUZE/BANDAGES/DRESSINGS) ×1
DERMABOND ADVANCED .7 DNX12 (GAUZE/BANDAGES/DRESSINGS) ×2 IMPLANT
DRAPE INCISE IOBAN 66X45 STRL (DRAPES) IMPLANT
DRAPE ORTHO SPLIT 77X108 STRL (DRAPES) ×4
DRAPE SHEET LG 3/4 BI-LAMINATE (DRAPES) ×3 IMPLANT
DRAPE SURG 17X11 SM STRL (DRAPES) ×3 IMPLANT
DRAPE SURG ORHT 6 SPLT 77X108 (DRAPES) ×4 IMPLANT
DRAPE TOP 10253 STERILE (DRAPES) ×3 IMPLANT
DRAPE U-SHAPE 47X51 STRL (DRAPES) ×3 IMPLANT
DRESSING AQUACEL AG SP 3.5X6 (GAUZE/BANDAGES/DRESSINGS) ×2 IMPLANT
DRSG AQUACEL AG ADV 3.5X 6 (GAUZE/BANDAGES/DRESSINGS) ×1 IMPLANT
DRSG AQUACEL AG ADV 3.5X10 (GAUZE/BANDAGES/DRESSINGS) IMPLANT
DRSG AQUACEL AG SP 3.5X6 (GAUZE/BANDAGES/DRESSINGS) ×2
DRSG TEGADERM 8X12 (GAUZE/BANDAGES/DRESSINGS) ×3 IMPLANT
DURAPREP 26ML APPLICATOR (WOUND CARE) ×3 IMPLANT
ELECT BLADE TIP CTD 4 INCH (ELECTRODE) ×3 IMPLANT
ELECT PENCIL ROCKER SW 15FT (MISCELLANEOUS) ×3 IMPLANT
ELECT REM PT RETURN 15FT ADLT (MISCELLANEOUS) ×3 IMPLANT
FACESHIELD WRAPAROUND (MASK) ×8 IMPLANT
FACESHIELD WRAPAROUND OR TEAM (MASK) ×8 IMPLANT
GLENOID UNI REV MOD 24 +2 LAT (Joint) ×1 IMPLANT
GLENOSPHERE 36 +4 LAT/24 (Joint) ×1 IMPLANT
GLOVE SRG 8 PF TXTR STRL LF DI (GLOVE) ×2 IMPLANT
GLOVE SURG ENC MOIS LTX SZ7 (GLOVE) ×3 IMPLANT
GLOVE SURG ENC MOIS LTX SZ7.5 (GLOVE) ×3 IMPLANT
GLOVE SURG UNDER POLY LF SZ7 (GLOVE) ×3 IMPLANT
GLOVE SURG UNDER POLY LF SZ8 (GLOVE) ×2
GOWN STRL REUS W/TWL LRG LVL3 (GOWN DISPOSABLE) ×6 IMPLANT
INSERT HUMERAL UNI REVERS 36 3 (Insert) ×1 IMPLANT
KIT BASIN OR (CUSTOM PROCEDURE TRAY) ×3 IMPLANT
KIT TURNOVER KIT A (KITS) ×1 IMPLANT
MANIFOLD NEPTUNE II (INSTRUMENTS) ×3 IMPLANT
NDL TAPERED W/ NITINOL LOOP (MISCELLANEOUS) ×2 IMPLANT
NEEDLE TAPERED W/ NITINOL LOOP (MISCELLANEOUS) ×2 IMPLANT
NS IRRIG 1000ML POUR BTL (IV SOLUTION) ×3 IMPLANT
PACK SHOULDER (CUSTOM PROCEDURE TRAY) ×3 IMPLANT
PAD ARMBOARD 7.5X6 YLW CONV (MISCELLANEOUS) ×3 IMPLANT
PAD COLD SHLDR WRAP-ON (PAD) ×3 IMPLANT
PIN NITINOL TARGETER 2.8 (PIN) IMPLANT
PIN SET MODULAR GLENOID SYSTEM (PIN) IMPLANT
RESTRAINT HEAD UNIVERSAL NS (MISCELLANEOUS) ×3 IMPLANT
SCREW CENTRAL MODULAR 25 (Screw) ×1 IMPLANT
SCREW PERI LOCK 5.5X16 (Screw) ×2 IMPLANT
SCREW PERI LOCK 5.5X36 (Screw) ×1 IMPLANT
SCREW PERIPHERAL 5.5X20 LOCK (Screw) ×1 IMPLANT
SLING ARM FOAM STRAP LRG (SOFTGOODS) IMPLANT
SLING ARM FOAM STRAP MED (SOFTGOODS) ×1 IMPLANT
SPONGE T-LAP 18X18 ~~LOC~~+RFID (SPONGE) ×3 IMPLANT
SPONGE T-LAP 4X18 ~~LOC~~+RFID (SPONGE) ×3 IMPLANT
STEM HUMERAL UNI REVERS SZ6 (Stem) ×1 IMPLANT
SUCTION FRAZIER HANDLE 12FR (TUBING) ×2
SUCTION TUBE FRAZIER 12FR DISP (TUBING) ×2 IMPLANT
SUT FIBERWIRE #2 38 T-5 BLUE (SUTURE) ×4
SUT MNCRL AB 3-0 PS2 18 (SUTURE) ×3 IMPLANT
SUT MON AB 2-0 CT1 36 (SUTURE) ×3 IMPLANT
SUT VIC AB 1 CT1 36 (SUTURE) ×3 IMPLANT
SUTURE FIBERWR #2 38 T-5 BLUE (SUTURE) IMPLANT
SUTURE TAPE 1.3 40 TPR END (SUTURE) ×4 IMPLANT
SUTURETAPE 1.3 40 TPR END (SUTURE) ×4
TOWEL OR 17X26 10 PK STRL BLUE (TOWEL DISPOSABLE) ×3 IMPLANT
TOWEL OR NON WOVEN STRL DISP B (DISPOSABLE) ×3 IMPLANT
TUBE SUCTION HIGH CAP CLEAR NV (SUCTIONS) ×3 IMPLANT
WATER STERILE IRR 1000ML POUR (IV SOLUTION) ×6 IMPLANT
YANKAUER SUCT BULB TIP 10FT TU (MISCELLANEOUS) ×3 IMPLANT

## 2021-02-03 NOTE — Op Note (Signed)
02/03/2021  1:59 PM  PATIENT:   Kathleen Reyes  71 y.o. female  PRE-OPERATIVE DIAGNOSIS:  Right shoulder rotator cuff tear arthropathy  POST-OPERATIVE DIAGNOSIS: Same  PROCEDURE: Right shoulder reverse arthroplasty utilizing a press-fit size 6 Arthrex stem with a neutral metaphysis, +3 constrained polyethylene insert, 36/+4 glenosphere on a small/+2 baseplate  SURGEON:  Olga Bourbeau, Metta Clines M.D.  ASSISTANTS: Jenetta Loges, PA-C  ANESTHESIA:   General endotracheal and interscalene block with Exparel  EBL: 200 cc  SPECIMEN: None  Drains: None   PATIENT DISPOSITION:  PACU - hemodynamically stable.    PLAN OF CARE: Discharge to home after PACU  Brief history:  Ms. Mink is a 71 year old female well-known to our practice after previous attempts of right shoulder rotator cuff repair x2 with unfortunate postoperative falls and failure of healing.  She now presents with increasing pain and functional limitations related to a rotator cuff tear arthropathy.  Due to her increasing difficulties and failure to respond to prolonged attempts at conservative management, she is brought to the operating room at this time for planned right shoulder reverse arthroplasty  Preoperatively, I counseled the patient regarding treatment options and risks versus benefits thereof.  Possible surgical complications were all reviewed including potential for bleeding, infection, neurovascular injury, persistent pain, loss of motion, anesthetic complication, failure of the implant, and possible need for additional surgery. They understand and accept and agrees with our planned procedure.   Procedure in detail:  After undergoing routine preop evaluation the patient received prophylactic antibiotics and interscalene block with Exparel was established in the holding area by the anesthesia department.  Patient was subsequently placed supine on the operating table and underwent the smooth induction of a general  endotracheal anesthesia.  Placed into the beachchair position and appropriately padded and protected.  The right shoulder girdle region was sterilely prepped and draped in standard fashion.  Timeout was called.  A deltopectoral approach was made to the right shoulder through an approximately 8 cm incision.  Skin flaps were elevated dissection carried deeply and the deltopectoral interval was developed from proximal to distal with the vein taken laterally.  The long head bicep tendon was then tenodesed at the upper border of the pectoralis major tendon and the proximal segment was unroofed and excised.  The subscapularis was elevated from the lesser tuberosity and tagged with a pair of grasping suture tape sutures.  Rotator cuff was deficient superiorly.  Anterior and inferior capsular attachments were then divided in a subperiosteal fashion allowing deliver the humeral head through the wound.  Extra medullary guide used to outline the proposed humeral head resection which was performed with an oscillating saw at approximate 20 degrees retroversion.  This exposed metaphysis were to the previously placed suture anchors were identified and removed with a rondure.  A metal cap was then placed over the cut proximal humeral surface we then exposed the glenoid with appropriate retractors.  A circumferential labral resection was then performed gaining complete visualization the periphery of the glenoid.  Glenoid was then reamed with the central followed by the peripheral reamer to a stable subchondral bony bed and preparation including the central drill and tapped for a 25 mm lag screw.  Baseplate was then assembled with vancomycin powder applied to the threads of the lag screw and the baseplate was then inserted with excellent fit and fixation.  All the peripheral locking screws were then placed using standard technique with excellent purchase and fixation.  A 36/+4 glenosphere was then impacted  over the baseplate and the  central locking screw was placed.  Attention returned to the humeral shaft where upon using the hand reamer to open the canal we encountered the remaining suture anchors and suture material which were all removed with a rondure.  The canal was then further opened hand reaming up to size 7 and ultimately broaching to a size 6 and the metaphysis was then reamed with a neutral reamer.  Trial reduction was performed showing good motion good stability and good soft tissue balance.  Trial was then removed the final implant was assembled.  The canal was cleaned and dried and vancomycin powder was then placed into the canal and the final implant was then seated with excellent fit and fixation.  We then performed a series of trial reductions and ultimately felt that the +3 gave [place] the motion stability and soft tissue balance.  We did select a constrained poly-.  The final polyp was then impacted after the implant was cleaned and dried.  Final reduction performed.  This point with range of motion we did noticed a mechanical phenomenon which developed in relation to the soft tissue remnants on the greater tuberosity abrading the undersurface of the acromion as the arm was abducted.  We went ahead and debrided this bulbous area of soft tissue and rongeured the bone to a smooth contour upon completion this eliminated the mechanical phenomenon.  At this point final hemostasis was obtained.  Copious irrigation completed.  Balance of the vancomycin powder was then spread liberally throughout the deep soft tissue planes.  The deltopectoral interval was reapproximated with a series of figure-of-eight 0 Vicryl sutures.  2-0 Monocryl used to the subcu layer and intracuticular 3-0 Monocryl for the skin followed by Dermabond and Aquacel dressing.  The right arm was placed into a sling.  The patient was awakened, extubated, and taken to the recovery room in stable condition.  Jenetta Loges, PA-C was utilized as an Environmental consultant throughout  this case, essential for help with positioning the patient, positioning extremity, tissue manipulation, implantation of the prosthesis, suture management, wound closure, and intraoperative decision-making.  Marin Shutter MD   Contact # [phone]

## 2021-02-03 NOTE — Progress Notes (Signed)
AssistedDr. Hollis with right, ultrasound guided, interscalene  block. Side rails up, monitors on throughout procedure. See vital signs in flow sheet. Tolerated Procedure well.  

## 2021-02-03 NOTE — Anesthesia Postprocedure Evaluation (Signed)
Anesthesia Post Note  Patient: MEKHI LASCOLA  Procedure(s) Performed: REVERSE SHOULDER ARTHROPLASTY (Right: Shoulder)     Patient location during evaluation: PACU Anesthesia Type: General Level of consciousness: awake and alert Pain management: pain level controlled Vital Signs Assessment: post-procedure vital signs reviewed and stable Respiratory status: spontaneous breathing, nonlabored ventilation, respiratory function stable and patient connected to nasal cannula oxygen Cardiovascular status: blood pressure returned to baseline and stable Postop Assessment: no apparent nausea or vomiting Anesthetic complications: no Comments: Patient complaint of armpit pain and itching. Responded to treatment. No shoulder pain. Still very anxious despite multiple doses of anxiolytics.    No notable events documented.  Last Vitals:  Vitals:   02/03/21 1615 02/03/21 1646  BP: (!) 163/94 (!) 161/87  Pulse: 88 87  Resp: 19   Temp: 37.1 C 36.6 C  SpO2: 95% 94%    Last Pain:  Vitals:   02/03/21 1646  TempSrc: Oral  PainSc:                  Effie Berkshire

## 2021-02-03 NOTE — Anesthesia Preprocedure Evaluation (Addendum)
Anesthesia Evaluation  Patient identified by MRN, date of birth, ID band Patient awake    Reviewed: Allergy & Precautions, NPO status , Patient's Chart, lab work & pertinent test results  Airway Mallampati: II  TM Distance: >3 FB Neck ROM: Full    Dental  (+) Teeth Intact, Dental Advisory Given, Caps   Pulmonary asthma ,    breath sounds clear to auscultation       Cardiovascular hypertension, Pt. on medications + Valvular Problems/Murmurs  Rhythm:Regular Rate:Normal     Neuro/Psych  Headaches, PSYCHIATRIC DISORDERS Anxiety    GI/Hepatic Neg liver ROS, PUD, GERD  Medicated,  Endo/Other  negative endocrine ROS  Renal/GU negative Renal ROS     Musculoskeletal negative musculoskeletal ROS (+)   Abdominal Normal abdominal exam  (+)   Peds  Hematology   Anesthesia Other Findings   Reproductive/Obstetrics                           Anesthesia Physical Anesthesia Plan  ASA: 2  Anesthesia Plan: General   Post-op Pain Management: Regional block   Induction: Intravenous  PONV Risk Score and Plan: 4 or greater and Ondansetron, Dexamethasone, Midazolam and Scopolamine patch - Pre-op  Airway Management Planned: Oral ETT  Additional Equipment: None  Intra-op Plan:   Post-operative Plan: Extubation in OR  Informed Consent: I have reviewed the patients History and Physical, chart, labs and discussed the procedure including the risks, benefits and alternatives for the proposed anesthesia with the patient or authorized representative who has indicated his/her understanding and acceptance.       Plan Discussed with: CRNA  Anesthesia Plan Comments:        Anesthesia Quick Evaluation

## 2021-02-03 NOTE — Transfer of Care (Signed)
Immediate Anesthesia Transfer of Care Note  Patient: ANIQUA BRIERE  Procedure(s) Performed: REVERSE SHOULDER ARTHROPLASTY (Right: Shoulder)  Patient Location: PACU  Anesthesia Type:GA combined with regional for post-op pain  Level of Consciousness: awake, alert , oriented and patient cooperative  Airway & Oxygen Therapy: Patient Spontanous Breathing and Patient connected to face mask oxygen  Post-op Assessment: Report given to RN and Post -op Vital signs reviewed and stable  Post vital signs: Reviewed and stable  Last Vitals:  Vitals Value Taken Time  BP 159/99 02/03/21 1423  Temp    Pulse 100 02/03/21 1428  Resp 22 02/03/21 1428  SpO2 91 % 02/03/21 1428  Vitals shown include unvalidated device data.  Last Pain:  Vitals:   02/03/21 1004  TempSrc:   PainSc: 3       Patients Stated Pain Goal: 0 (09/64/38 3818)  Complications: No notable events documented.

## 2021-02-03 NOTE — Plan of Care (Signed)
°  Problem: Education: Goal: Knowledge of the prescribed therapeutic regimen will improve Outcome: Progressing   Problem: Activity: Goal: Ability to tolerate increased activity will improve Outcome: Progressing   Problem: Pain Management: Goal: Pain level will decrease with appropriate interventions Outcome: Progressing   

## 2021-02-03 NOTE — H&P (Signed)
Kathleen Reyes    Chief Complaint: Right shoulder rotator cuff tear arthropathy HPI: The patient is a 71 y.o. female with chronic and progressively increasing right shoulder pain related to rotator cuff tear arthropathy.  She is status post 2 previous attempted rotator cuff repairs and unfortunately has failed to heal.  She now has ongoing significant pain and limited mobility and is brought to the operating room at this time for planned right shoulder reverse arthroplasty  Past Medical History:  Diagnosis Date   Asthma    Bleeding stomach ulcer x4   1989 & 2009, 2011   Family history of adverse reaction to anesthesia    GERD (gastroesophageal reflux disease)    H/O renal calculi    Heart murmur    Hypertension    Migraines    Ulcer     Past Surgical History:  Procedure Laterality Date   ABDOMINAL HYSTERECTOMY  01/24/1980   BSO, secondary to endometriosis   ELBOW SURGERY Left about 2004   KNEE SURGERY Right 2011 & one other scope   SHOULDER ARTHROSCOPY Left    x2   TONSILLECTOMY     UPPER GASTROINTESTINAL ENDOSCOPY     WISDOM TOOTH EXTRACTION      Family History  Problem Relation Age of Onset   Colon cancer Father 74   Hypertension Father    Diabetes Father    Hypertension Mother    Hyperlipidemia Mother    Diabetes Brother    Hypertension Brother    Hyperlipidemia Brother     Social History:  reports that she has never smoked. She has never used smokeless tobacco. She reports current alcohol use of about 3.0 standard drinks per week. She reports that she does not use drugs.   Medications Prior to Admission  Medication Sig Dispense Refill   ALPRAZolam (XANAX) 0.25 MG tablet Take 0.25 mg by mouth at bedtime.     Bioflavonoid Products (BIOFLEX) TABS Take 1 tablet by mouth daily.     Calcium Carb-Cholecalciferol (SM CALCIUM 500/VITAMIN D3 PO) Take 1 tablet by mouth daily.     cetirizine (ZYRTEC) 10 MG tablet Take 10 mg by mouth daily.     esomeprazole (NEXIUM) 20 MG  capsule Take 20 mg by mouth daily.     fluticasone (FLONASE) 50 MCG/ACT nasal spray Place 1 spray into both nostrils daily.     Fluticasone-Salmeterol (ADVAIR) 100-50 MCG/DOSE AEPB Inhale 1 puff into the lungs in the morning and at bedtime.     gabapentin (NEURONTIN) 600 MG tablet Take 600 mg by mouth at bedtime.     hydrochlorothiazide (HYDRODIURIL) 50 MG tablet Take 50 mg by mouth daily.     losartan (COZAAR) 50 MG tablet Take 50 mg by mouth at bedtime.     PROAIR HFA 108 (90 BASE) MCG/ACT inhaler Inhale 2 puffs into the lungs every 6 (six) hours as needed for shortness of breath or wheezing.     rOPINIRole (REQUIP) 3 MG tablet Take 3 mg by mouth at bedtime.       Physical Exam: Right shoulder demonstrates painful and guarded motion is noted at her multiple recent office visits.  She is globally decreased strength.  Previous arthroscopy portals are all well-healed.  She is neurovascular intact distally.  Recent MRI scan confirms recurrent severely retracted rotator cuff tear with multiple retained suture anchors within the humeral head  Vitals  Temp:  [98.5 F (36.9 C)] 98.5 F (36.9 C) (01/12 1002) Pulse Rate:  [103] 103 (01/12 1002)  Resp:  [12] 12 (01/12 1002) BP: (155)/(89) 155/89 (01/12 1002) SpO2:  [100 %] 100 % (01/12 1002)  Assessment/Plan  Impression: Right shoulder rotator cuff tear arthropathy  Plan of Action: Procedure(s): REVERSE SHOULDER ARTHROPLASTY  Geran Haithcock M Daja Shuping 02/03/2021, 11:18 AM Contact # [phone]

## 2021-02-03 NOTE — Plan of Care (Signed)
  Problem: Pain Management: Goal: Pain level will decrease with appropriate interventions Outcome: Progressing   

## 2021-02-03 NOTE — Discharge Instructions (Signed)
? ?Kathleen Reyes, M.D., F.A.A.O.S. ?Orthopaedic Surgery ?Specializing in Arthroscopic and Reconstructive ?Surgery of the Shoulder ?[phone] ?[address] - [place], [place] [address] - Fax [phone] ? ? ?POST-OP TOTAL SHOULDER REPLACEMENT INSTRUCTIONS ? ?1. Follow up in the office for your first post-op appointment 10-14 days from the date of your surgery. If you do not already have a scheduled appointment, our office will contact you to schedule. ? ?2. The bandage over your incision is waterproof. You may begin showering with this dressing on. You may leave this dressing on until first follow up appointment within 2 weeks. We prefer you leave this dressing in place until follow up however after 5-7 days if you are having itching or skin irritation and would like to remove it you may do so. Go slow and tug at the borders gently to break the bond the dressing has with the skin. At this point if there is no drainage it is okay to go without a bandage or you may cover it with a light guaze and tape. You can also expect significant bruising around your shoulder that will drift down your arm and into your chest wall. This is very normal and should resolve over several days. ? ? 3. Wear your sling/immobilizer at all times except to perform the exercises below or to occasionally let your arm dangle by your side to stretch your elbow. You also need to sleep in your sling immobilizer until instructed otherwise. It is ok to remove your sling if you are sitting in a controlled environment and allow your arm to rest in a position of comfort by your side or on your lap with pillows to give your neck and skin a break from the sling. You may remove it to allow arm to dangle by side to shower. If you are up walking around and when you go to sleep at night you need to wear it. ? ?4. Range of motion to your elbow, wrist, and hand are encouraged 3-5 times daily. Exercise to your hand and fingers helps to reduce  swelling you may experience. ? ? ?5. Prescriptions for a pain medication and a muscle relaxant are provided for you. It is recommended that if you are experiencing pain that you pain medication alone is not controlling, add the muscle relaxant along with the pain medication which can give additional pain relief. The first 1-2 days is generally the most severe of your pain and then should gradually decrease. As your pain lessens it is recommended that you decrease your use of the pain medications to an "as needed basis'" only and to always comply with the recommended dosages of the pain medications. ? ?6. Pain medications can produce constipation along with their use. If you experience this, the use of an over the counter stool softener or laxative daily is recommended.  ? ?7. For additional questions or concerns, please do not hesitate to call the office. If after hours there is an answering service to forward your concerns to the physician on call. ? ?8.Pain control following an exparel block ? ?To help control your post-operative pain you received a nerve block  performed with Exparel which is a long acting anesthetic (numbing agent) which can provide pain relief and sensations of numbness (and relief of pain) in the operative shoulder and arm for up to 3 days. Sometimes it provides mixed relief, meaning you may still have numbness in certain areas of the arm but can still be able to   move  parts of that arm, hand, and fingers. We recommend that your prescribed pain medications  be used as needed. We do not feel it is necessary to "pre medicate" and "stay ahead" of pain.  Taking narcotic pain medications when you are not having any pain can lead to unnecessary and potentially dangerous side effects.   ? ?9. Use the ice machine as much as possible in the first 5-7 days from surgery, then you can wean its use to as needed. The ice typically needs to be replaced every 6 hours, instead of ice you can actually freeze  water bottles to put in the cooler and then fill water around them to avoid having to purchase ice. You can have spare water bottles freezing to allow you to rotate them once they have melted. Try to have a thin shirt or light cloth or towel under the ice wrap to protect your skin.  ? ?FOR ADDITIONAL INFO ON ICE MACHINE AND INSTRUCTIONS GO TO THE WEBSITE AT ? ?https://www.[employer].com/products/donjoy/donjoy-iceman-[email or web address] ? ?10.  We recommend that you avoid any dental work or cleaning in the first 3 months following your joint replacement. This is to help minimize the possibility of infection from the bacteria in your mouth that enters your bloodstream during dental work. We also recommend that you take an antibiotic prior to your dental work for the first year after your shoulder replacement to further help reduce that risk. Please simply contact our office for antibiotics to be sent to your pharmacy prior to dental work. ? ?11. Dental Antibiotics: ? ?We recommend waiting at least 3 months for any dental work even cleanings unless there is a dental emergency. We also recommend  prophylactic antibiotics for all dental procdeures  the first year following your joint replacement. In some exceptions we recommend them to be used lifelong. We will provide you with that prescription in follow up office visits, or you can call our office. ? ?Exceptions are as follows: ? ?1. History of prior total joint infection ? ?2. Severely immunocompromised (Organ Transplant, cancer chemotherapy, Rheumatoid biologic ?meds such as Humera) ? ?3. Poorly controlled diabetes (A1C &gt; 8.0, blood glucose over 200) ? ? ?POST-OP EXERCISES ? ?Pendulum Exercises ? ?Perform pendulum exercises while standing and bending at the waist. Support your uninvolved arm on a table or chair and allow your operated arm to hang freely. Make sure to do these exercises passively - not using you shoulder muscles. These exercises can be performed once your  nerve block effects have worn off. ? ?Repeat 20 times. Do 3 sessions per day. ? ? ?  ?

## 2021-02-03 NOTE — Anesthesia Procedure Notes (Signed)
Anesthesia Regional Block: Interscalene brachial plexus block   Pre-Anesthetic Checklist: , timeout performed,  Correct Patient, Correct Site, Correct Laterality,  Correct Procedure, Correct Position, site marked,  Risks and benefits discussed,  Surgical consent,  Pre-op evaluation,  At surgeon's request and post-op pain management  Laterality: Right  Prep: chloraprep       Needles:  Injection technique: Single-shot  Needle Type: Echogenic Stimulator Needle     Needle Length: 9cm  Needle Gauge: 21     Additional Needles:   Procedures:,,,, ultrasound used (permanent image in chart),,    Narrative:  Start time: 02/03/2021 11:30 AM End time: 02/03/2021 11:35 AM Injection made incrementally with aspirations every 5 mL.  Performed by: Personally  Anesthesiologist: Effie Berkshire, MD  Additional Notes: Difficult IV stick.   Patient did not tolerate the procedure well emotionally. 2mg  Midazolam and 50 mcg fentanyl administered 5 minutes prior to the procedure. An additional 50 mcg fentanyl administered after procedure started for additional comfort. Local anesthetic introduced in an incremental fashion under minimal resistance after negative aspirations. No paresthesias were elicited. After completion of the procedure, patient tearful stating she does not think the medication worked. Her vitals were stable throughout and patient continues to be monitored by RN.

## 2021-02-03 NOTE — Anesthesia Procedure Notes (Addendum)
Procedure Name: Intubation Date/Time: 02/03/2021 12:11 PM Performed by: West Pugh, CRNA Pre-anesthesia Checklist: Patient identified, Emergency Drugs available, Suction available, Patient being monitored and Timeout performed Patient Re-evaluated:Patient Re-evaluated prior to induction Oxygen Delivery Method: Circle system utilized Preoxygenation: Pre-oxygenation with 100% oxygen Induction Type: IV induction Ventilation: Mask ventilation without difficulty Laryngoscope Size: Mac and 3 Grade View: Grade III Tube type: Oral Tube size: 7.0 mm Number of attempts: 1 Airway Equipment and Method: Stylet Placement Confirmation: ETT inserted through vocal cords under direct vision, positive ETCO2, CO2 detector and breath sounds checked- equal and bilateral Secured at: 22 cm Tube secured with: Tape Dental Injury: Teeth and Oropharynx as per pre-operative assessment

## 2021-02-04 DIAGNOSIS — M75101 Unspecified rotator cuff tear or rupture of right shoulder, not specified as traumatic: Secondary | ICD-10-CM | POA: Diagnosis not present

## 2021-02-04 MED ORDER — CYCLOBENZAPRINE HCL 10 MG PO TABS: 10.0000 mg | ORAL_TABLET | Freq: Three times a day (TID) | ORAL | 1 refills | Status: AC | PRN

## 2021-02-04 MED ORDER — ONDANSETRON HCL 4 MG PO TABS: 4.0000 mg | ORAL_TABLET | Freq: Three times a day (TID) | ORAL | 0 refills | Status: AC | PRN

## 2021-02-04 MED ORDER — OXYCODONE-ACETAMINOPHEN 5-325 MG PO TABS: 1.0000 | ORAL_TABLET | ORAL | 0 refills | Status: AC | PRN

## 2021-02-04 NOTE — Progress Notes (Signed)
The patient is alert and oriented and has been seen by her physician. The orders for discharge were written. IV has been removed. Went over discharge instructions with patient and family. She is being discharged via wheelchair with all of her belongings.  

## 2021-02-04 NOTE — Evaluation (Signed)
Occupational Therapy Evaluation Patient Details Name: Kathleen Reyes MRN: [id number] DOB: 22-Feb-1950 Today's Date: 02/04/2021   History of Present Illness Patient is a 71 year old female s/p R reverse total shoulder arthroplasty. PMH includes heart murmur, HTN   Clinical Impression   Patient is a 71 year old female s/p shoulder replacement without functional use of right dominant upper extremity secondary to effects of surgery and interscalene block and shoulder precautions. Therapist provided education and instruction to patient in regards to exercises, precautions, positioning, donning upper extremity clothing and bathing while maintaining shoulder precautions, ice and edema management and donning/doffing sling. Patient verbalized understanding and demonstrated as needed. Patient needed assistance to donn shirt, underwear, pants, socks and shoes and provided with instruction on compensatory strategies to perform ADLs. Patient to follow up with MD for further therapy needs.        Recommendations for follow up therapy are one component of a multi-disciplinary discharge planning process, led by the attending physician.  Recommendations may be updated based on patient status, additional functional criteria and insurance authorization.   Follow Up Recommendations  Follow physician's recommendations for discharge plan and follow up therapies    Assistance Recommended at Discharge PRN  Patient can return home with the following A little help with bathing/dressing/bathroom       Equipment Recommendations  None recommended by OT       Precautions / Restrictions Precautions Precautions: Shoulder Type of Shoulder Precautions: If sitting in controlled environment, ok to come out of sling to give neck a break. Please sleep in it to protect until follow up in office.  OK to use operative arm for feeding, hygiene and ADLs. Ok to instruct Pendulums and lap slides as exercises. Ok to use operative arm  within the following parameters for ADL purposes  Ok for PROM, AAROM, AROM within pain tolerance and within the following ROM   ER 20   ABD 45   FE 60. AROM elbow, wrist, hand ok Shoulder Interventions: Shoulder sling/immobilizer;Off for dressing/bathing/exercises Precaution Booklet Issued: Yes (comment) Required Braces or Orthoses: Sling Restrictions Weight Bearing Restrictions: Yes RUE Weight Bearing: Non weight bearing      Mobility Bed Mobility Overal bed mobility: Modified Independent                  Transfers Overall transfer level: Independent                        Balance Overall balance assessment: Independent                                         ADL either performed or assessed with clinical judgement   ADL Overall ADL's : Needs assistance/impaired Eating/Feeding: Sitting;Set up   Grooming: Wash/dry hands;Independent;Standing   Upper Body Bathing: Minimal assistance;Standing   Lower Body Bathing: Independent;Sit to/from stand   Upper Body Dressing : Minimal assistance;Cueing for sequencing;Standing Upper Body Dressing Details (indicate cue type and reason): Cue to thread R UE first Lower Body Dressing: Independent;Sit to/from stand Lower Body Dressing Details (indicate cue type and reason): Donning unerwear and pants Toilet Transfer: Ambulation;Regular Toilet;Grab bars;Modified Independent   Toileting- Clothing Manipulation and Hygiene: Independent;Sit to/from stand       Functional mobility during ADLs: Independent General ADL Comments: Patient educated in shoulder precautions and how to maintain during self care tasks.  Pertinent Vitals/Pain Pain Assessment: Faces Faces Pain Scale: Hurts a little bit Pain Location: R UE Pain Descriptors / Indicators: Numbness;Heaviness Pain Intervention(s): Monitored during session     Hand Dominance  (ambidextrous)   Extremity/Trunk Assessment Upper Extremity  Assessment Upper Extremity Assessment: RUE deficits/detail RUE Deficits / Details: + nerve block   Lower Extremity Assessment Lower Extremity Assessment: Overall WFL for tasks assessed       Communication Communication Communication: No difficulties   Cognition Arousal/Alertness: Awake/alert Behavior During Therapy: WFL for tasks assessed/performed Overall Cognitive Status: Within Functional Limits for tasks assessed                                          Exercises Exercises: Shoulder   Shoulder Instructions Shoulder Instructions Donning/doffing shirt without moving shoulder: Minimal assistance;Patient able to independently direct caregiver Method for sponge bathing under operated UE: Minimal assistance;Patient able to independently direct caregiver Donning/doffing sling/immobilizer: Moderate assistance;Patient able to independently direct caregiver Correct positioning of sling/immobilizer: Minimal assistance;Patient able to independently direct caregiver Pendulum exercises (written home exercise program): Patient able to independently direct caregiver ROM for elbow, wrist and digits of operated UE: Patient able to independently direct caregiver Sling wearing schedule (on at all times/off for ADL's): Patient able to independently direct caregiver Proper positioning of operated UE when showering: Patient able to independently direct caregiver Dressing change:  (N/A) Positioning of UE while sleeping: Patient able to independently direct caregiver    Home Living Family/patient expects to be discharged to:: Private residence Living Arrangements: Children;Other (Comment) (granddaughters) Available Help at Discharge: Family Type of Home: House Home Access: Level entry     Home Layout: Two level;Able to live on main level with bedroom/bathroom     Bathroom Shower/Tub: Tub/shower unit         Home Equipment: Other (comment) (Going purchase grab bars and  shower chair once home)          Prior Functioning/Environment Prior Level of Function : Independent/Modified Independent                        OT Problem List: Pain;Impaired UE functional use;Decreased knowledge of precautions         OT Goals(Current goals can be found in the care plan section) Acute Rehab OT Goals Patient Stated Goal: Home OT Goal Formulation: All assessment and education complete, DC therapy      AM-PAC OT "6 Clicks" Daily Activity     Outcome Measure Help from another person eating meals?: A Little Help from another person taking care of personal grooming?: None Help from another person toileting, which includes using toliet, bedpan, or urinal?: None Help from another person bathing (including washing, rinsing, drying)?: A Little Help from another person to put on and taking off regular upper body clothing?: A Little Help from another person to put on and taking off regular lower body clothing?: None 6 Click Score: 21   End of Session Equipment Utilized During Treatment: Other (comment) (sling) Nurse Communication: Other (comment) (OT complete)  Activity Tolerance: Patient tolerated treatment well Patient left: in chair;with call bell/phone within reach  OT Visit Diagnosis: Pain Pain - Right/Left: Right Pain - part of body: Shoulder                Time: 1610-9604 OT Time Calculation (min): 39 min Charges:  OT General Charges $OT  Visit: 1 Visit OT Evaluation $OT Eval Low Complexity: 1 Low OT Treatments $Self Care/Home Management : 23-37 mins  Delbert Phenix OT OT pager: [phone]  Rosemary Holms 02/04/2021, 10:38 AM

## 2021-02-04 NOTE — Discharge Summary (Signed)
PATIENT ID:      Kathleen Reyes  MRN:     [id number] DOB/AGE:    [birth year] / 71 y.o.     DISCHARGE SUMMARY  ADMISSION DATE:    02/03/2021 DISCHARGE DATE:  02/04/2021  ADMISSION DIAGNOSIS: Right shoulder rotator cuff tear arthropathy Past Medical History:  Diagnosis Date   Asthma    Bleeding stomach ulcer x4   1989 & 2009, 2011   Family history of adverse reaction to anesthesia    GERD (gastroesophageal reflux disease)    H/O renal calculi    Heart murmur    Hypertension    Migraines    Ulcer     DISCHARGE DIAGNOSIS:   Principal Problem:   S/P reverse total shoulder arthroplasty, right   PROCEDURE: Procedure(s): REVERSE SHOULDER ARTHROPLASTY on 02/03/2021  CONSULTS:    HISTORY:  See H&P in chart.  HOSPITAL COURSE:  Kathleen Reyes is a 71 y.o. admitted on 02/03/2021 with a diagnosis of Right shoulder rotator cuff tear arthropathy.  They were brought to the operating room on 02/03/2021 and underwent Procedure(s): [place].    They were given perioperative antibiotics:  Anti-infectives (From admission, onward)    Start     Dose/Rate Route Frequency Ordered Stop   02/03/21 1233  vancomycin (VANCOCIN) powder  Status:  Discontinued          As needed 02/03/21 1233 02/03/21 1628   02/03/21 1000  ceFAZolin (ANCEF) IVPB 2g/100 mL premix        2 g 200 mL/hr over 30 Minutes Intravenous On call to O.R. 02/03/21 5732 02/03/21 1245     .  Patient underwent the above named procedure and tolerated it well. She had a very hard time with pain control in recovery room and a supplemental field block was administered and she was kept overnight for pain controlThe following day they were hemodynamically stable and pain was controlled on oral analgesics. They were neurovascularly intact to the operative extremity, effects of block still working. OT was ordered and worked with patient per protocol. They were medically and orthopaedically stable for discharge on  .    DIAGNOSTIC STUDIES:  RECENT RADIOGRAPHIC STUDIES :  No results found.  RECENT VITAL SIGNS:  Patient Vitals for the past 24 hrs:  BP Temp Temp src Pulse Resp SpO2 Height Weight  02/04/21 0452 122/78 97.9 F (36.6 C) Oral 88 18 (!) 89 % -- --  02/04/21 0102 115/77 98.2 F (36.8 C) Oral 94 18 91 % -- --  02/03/21 2047 (!) 148/90 98 F (36.7 C) Oral (!) 107 18 93 % -- --  02/03/21 1822 (!) 156/96 97.7 F (36.5 C) Oral 97 20 95 % -- --  02/03/21 1720 (!) 161/87 97.9 F (36.6 C) Oral 87 17 -- 5\' 3"  (1.6 m) 70.2 kg  02/03/21 1646 (!) 161/87 97.9 F (36.6 C) Oral 87 17 94 % -- --  02/03/21 1615 (!) 163/94 98.7 F (37.1 C) -- 88 19 95 % -- --  02/03/21 1600 (!) 141/87 -- -- 94 17 94 % -- --  02/03/21 1545 (!) 163/94 -- -- (!) 109 20 95 % -- --  02/03/21 1530 (!) 160/94 -- -- (!) 109 (!) 25 94 % -- --  02/03/21 1515 (!) 144/76 98.9 F (37.2 C) -- (!) 102 17 91 % -- --  02/03/21 1500 (!) 153/94 -- -- (!) 110 18 92 % -- --  02/03/21 1445 (!) 157/96 -- -- (!) 108 19  92 % -- --  02/03/21 1442 (!) 153/88 -- -- (!) 113 18 (!) 86 % -- --  02/03/21 1430 (!) 169/145 -- -- (!) 103 (!) 24 92 % -- --  02/03/21 1423 (!) 159/99 98.4 F (36.9 C) -- (!) 103 (!) 22 99 % -- --  02/03/21 1137 (!) 180/103 -- -- (!) 103 20 98 % -- --  02/03/21 1132 -- -- -- (!) 105 (!) 22 100 % -- --  02/03/21 1127 -- -- -- (!) 102 18 100 % -- --  02/03/21 1122 -- -- -- 93 (!) 26 99 % -- --  02/03/21 1117 -- -- -- 92 17 100 % -- --  02/03/21 1012 -- -- -- 100 (!) 21 98 % -- --  02/03/21 1007 -- -- -- 99 18 98 % -- --  02/03/21 1002 (!) 155/89 98.5 F (36.9 C) Oral (!) 103 12 100 % -- --  .  RECENT EKG RESULTS:    Orders placed or performed during the hospital encounter of 12/06/20   ED EKG   ED EKG   EKG    DISCHARGE INSTRUCTIONS:    DISCHARGE MEDICATIONS:   Allergies as of 02/04/2021       Reactions   Nsaids Other (See Comments)   GI bleeds   Sulfonamide Derivatives Hives        Medication  List     TAKE these medications    ALPRAZolam 0.25 MG tablet Commonly known as: XANAX Take 0.25 mg by mouth at bedtime.   Bioflex Tabs Take 1 tablet by mouth daily.   cetirizine 10 MG tablet Commonly known as: ZYRTEC Take 10 mg by mouth daily.   cyclobenzaprine 10 MG tablet Commonly known as: FLEXERIL Take 1 tablet (10 mg total) by mouth 3 (three) times daily as needed for muscle spasms.   cyclobenzaprine 10 MG tablet Commonly known as: FLEXERIL Take 1 tablet (10 mg total) by mouth 3 (three) times daily as needed for muscle spasms.   esomeprazole 20 MG capsule Commonly known as: NEXIUM Take 20 mg by mouth daily.   fluticasone 50 MCG/ACT nasal spray Commonly known as: FLONASE Place 1 spray into both nostrils daily.   Fluticasone-Salmeterol 100-50 MCG/DOSE Aepb Commonly known as: ADVAIR Inhale 1 puff into the lungs in the morning and at bedtime.   gabapentin 600 MG tablet Commonly known as: NEURONTIN Take 600 mg by mouth at bedtime.   hydrochlorothiazide 50 MG tablet Commonly known as: HYDRODIURIL Take 50 mg by mouth daily.   losartan 50 MG tablet Commonly known as: COZAAR Take 50 mg by mouth at bedtime.   ondansetron 4 MG tablet Commonly known as: Zofran Take 1 tablet (4 mg total) by mouth every 8 (eight) hours as needed for nausea or vomiting.   ondansetron 4 MG tablet Commonly known as: Zofran Take 1 tablet (4 mg total) by mouth every 8 (eight) hours as needed for nausea or vomiting.   oxyCODONE-acetaminophen 5-325 MG tablet Commonly known as: Percocet Take 1 tablet by mouth every 4 (four) hours as needed (max 6 q).   oxyCODONE-acetaminophen 5-325 MG tablet Commonly known as: Percocet Take 1 tablet by mouth every 4 (four) hours as needed (max 6 q).   ProAir HFA 108 (90 Base) MCG/ACT inhaler Generic drug: albuterol Inhale 2 puffs into the lungs every 6 (six) hours as needed for shortness of breath or wheezing.   rOPINIRole 3 MG tablet Commonly  known as: REQUIP Take 3 mg by mouth at  bedtime.   SM CALCIUM 500/VITAMIN D3 PO Take 1 tablet by mouth daily.        FOLLOW UP VISIT:    Follow-up Information     Justice Britain, MD Follow up.   Specialty: Orthopedic Surgery Why: 02-16-2021 at 10:00 AM for post-op Contact information: [address] [place] [address] [phone]                 DISCHARGE TO: Home   DISCHARGE CONDITION:  Thereasa Parkin Farhiya Rosten for Dr. Justice Britain 02/04/2021, 8:04 AM

## 2021-02-04 NOTE — Plan of Care (Signed)
  Problem: Education: Goal: Knowledge of General Education information will improve Description Including pain rating scale, medication(s)/side effects and non-pharmacologic comfort measures Outcome: Progressing   Problem: Health Behavior/Discharge Planning: Goal: Ability to manage health-related needs will improve Outcome: Progressing   

## 2021-02-07 ENCOUNTER — Encounter (HOSPITAL_COMMUNITY): Payer: Medicare Other

## 2021-02-07 ENCOUNTER — Encounter (HOSPITAL_COMMUNITY): Payer: Self-pay | Admitting: Orthopedic Surgery

## 2021-11-04 ENCOUNTER — Other Ambulatory Visit: Payer: Self-pay | Admitting: Family Medicine

## 2021-11-04 DIAGNOSIS — Z1231 Encounter for screening mammogram for malignant neoplasm of breast: Secondary | ICD-10-CM

## 2021-11-11 ENCOUNTER — Ambulatory Visit
Admission: RE | Admit: 2021-11-11 | Discharge: 2021-11-11 | Disposition: A | Discharge status: Home / Self Care | Payer: Medicare Other | Source: Ambulatory Visit | Attending: Family Medicine | Admitting: Family Medicine

## 2021-11-11 DIAGNOSIS — Z1231 Encounter for screening mammogram for malignant neoplasm of breast: Secondary | ICD-10-CM

## 2021-11-15 ENCOUNTER — Other Ambulatory Visit: Payer: Self-pay | Admitting: Family Medicine

## 2021-11-15 DIAGNOSIS — R928 Other abnormal and inconclusive findings on diagnostic imaging of breast: Secondary | ICD-10-CM

## 2021-11-23 ENCOUNTER — Ambulatory Visit: Payer: Medicare Other

## 2021-11-23 ENCOUNTER — Ambulatory Visit
Admission: RE | Admit: 2021-11-23 | Discharge: 2021-11-23 | Disposition: A | Discharge status: Home / Self Care | Payer: Medicare Other | Source: Ambulatory Visit | Attending: Family Medicine | Admitting: Family Medicine

## 2021-11-23 DIAGNOSIS — R928 Other abnormal and inconclusive findings on diagnostic imaging of breast: Secondary | ICD-10-CM

## 2023-08-23 ENCOUNTER — Other Ambulatory Visit: Payer: Self-pay | Admitting: Family Medicine

## 2023-08-23 DIAGNOSIS — Z0000 Encounter for general adult medical examination without abnormal findings: Secondary | ICD-10-CM

## 2023-09-06 ENCOUNTER — Ambulatory Visit
Admission: RE | Admit: 2023-09-06 | Discharge: 2023-09-06 | Disposition: A | Discharge status: Home / Self Care | Source: Ambulatory Visit | Attending: Family Medicine | Admitting: Family Medicine

## 2023-09-06 DIAGNOSIS — Z0000 Encounter for general adult medical examination without abnormal findings: Secondary | ICD-10-CM
